# Patient Record
Sex: Female | Born: 1945 | Race: White | Hispanic: No | Smoking: Never smoker
Health system: Southern US, Community
[De-identification: ages and names within clinical notes are randomized; demographics above are authoritative.]

## PROBLEM LIST (undated history)

## (undated) DIAGNOSIS — N189 Chronic kidney disease, unspecified: Secondary | ICD-10-CM

## (undated) DIAGNOSIS — F329 Major depressive disorder, single episode, unspecified: Secondary | ICD-10-CM

## (undated) DIAGNOSIS — D472 Monoclonal gammopathy: Secondary | ICD-10-CM

## (undated) DIAGNOSIS — M7580 Other shoulder lesions, unspecified shoulder: Secondary | ICD-10-CM

## (undated) DIAGNOSIS — F32A Depression, unspecified: Secondary | ICD-10-CM

## (undated) DIAGNOSIS — Z972 Presence of dental prosthetic device (complete) (partial): Secondary | ICD-10-CM

## (undated) DIAGNOSIS — I10 Essential (primary) hypertension: Secondary | ICD-10-CM

## (undated) HISTORY — PX: ABDOMINAL HYSTERECTOMY: SHX81

## (undated) HISTORY — DX: Monoclonal gammopathy: D47.2

## (undated) HISTORY — PX: GANGLION CYST EXCISION: SHX1691

## (undated) HISTORY — DX: Essential (primary) hypertension: I10

## (undated) HISTORY — PX: CARPAL TUNNEL RELEASE: SHX101

## (undated) HISTORY — PX: SHOULDER ARTHROSCOPY W/ ACROMIAL REPAIR: SUR94

## (undated) HISTORY — PX: PARTIAL HYSTERECTOMY: SHX80

## (undated) HISTORY — DX: Other shoulder lesions, unspecified shoulder: M75.80

---

## 2004-07-02 ENCOUNTER — Ambulatory Visit: Payer: Self-pay | Admitting: Internal Medicine

## 2005-07-26 ENCOUNTER — Ambulatory Visit: Payer: Self-pay | Admitting: Internal Medicine

## 2005-07-29 ENCOUNTER — Ambulatory Visit: Payer: Self-pay | Admitting: Internal Medicine

## 2006-02-10 ENCOUNTER — Ambulatory Visit: Payer: Self-pay | Admitting: Internal Medicine

## 2007-01-05 ENCOUNTER — Ambulatory Visit: Payer: Self-pay | Admitting: Nurse Practitioner

## 2008-02-19 ENCOUNTER — Ambulatory Visit: Payer: Self-pay | Admitting: Nurse Practitioner

## 2010-04-30 ENCOUNTER — Emergency Department: Payer: Self-pay | Admitting: Emergency Medicine

## 2012-03-29 DIAGNOSIS — D472 Monoclonal gammopathy: Secondary | ICD-10-CM

## 2012-03-29 HISTORY — DX: Monoclonal gammopathy: D47.2

## 2012-07-26 ENCOUNTER — Ambulatory Visit: Payer: Self-pay | Admitting: Internal Medicine

## 2012-07-26 LAB — HM MAMMOGRAPHY: HM Mammogram: NORMAL

## 2012-10-31 ENCOUNTER — Ambulatory Visit: Payer: Self-pay | Admitting: Internal Medicine

## 2012-11-01 LAB — COMPREHENSIVE METABOLIC PANEL
Albumin: 4 g/dL (ref 3.4–5.0)
Alkaline Phosphatase: 75 U/L (ref 50–136)
BUN: 13 mg/dL (ref 7–18)
Bilirubin,Total: 0.8 mg/dL (ref 0.2–1.0)
Calcium, Total: 9.9 mg/dL (ref 8.5–10.1)
Co2: 31 mmol/L (ref 21–32)
Creatinine: 1.38 mg/dL — ABNORMAL HIGH (ref 0.60–1.30)
EGFR (African American): 46 — ABNORMAL LOW
Glucose: 111 mg/dL — ABNORMAL HIGH (ref 65–99)
Potassium: 3.2 mmol/L — ABNORMAL LOW (ref 3.5–5.1)
SGOT(AST): 52 U/L — ABNORMAL HIGH (ref 15–37)
Sodium: 140 mmol/L (ref 136–145)
Total Protein: 8.4 g/dL — ABNORMAL HIGH (ref 6.4–8.2)

## 2012-11-01 LAB — CBC CANCER CENTER
Basophil #: 0.1 x10 3/mm (ref 0.0–0.1)
Basophil %: 1 %
Eosinophil %: 1.2 %
MCHC: 34.5 g/dL (ref 32.0–36.0)
Monocyte #: 0.8 x10 3/mm (ref 0.2–0.9)
Neutrophil %: 55.4 %
Platelet: 194 x10 3/mm (ref 150–440)
RDW: 13.6 % (ref 11.5–14.5)
WBC: 7.6 x10 3/mm (ref 3.6–11.0)

## 2012-11-04 LAB — PROT IMMUNOELECTROPHORES(ARMC)

## 2012-11-27 ENCOUNTER — Ambulatory Visit: Payer: Self-pay | Admitting: Internal Medicine

## 2013-02-07 ENCOUNTER — Ambulatory Visit: Payer: Self-pay | Admitting: Internal Medicine

## 2013-02-07 LAB — CBC CANCER CENTER
Eosinophil %: 2.9 %
HCT: 42.9 % (ref 35.0–47.0)
Lymphocyte #: 2 x10 3/mm (ref 1.0–3.6)
MCV: 96 fL (ref 80–100)
Monocyte #: 0.7 x10 3/mm (ref 0.2–0.9)
Monocyte %: 8.6 %
Neutrophil #: 4.9 x10 3/mm (ref 1.4–6.5)
RDW: 13.3 % (ref 11.5–14.5)

## 2013-02-07 LAB — BASIC METABOLIC PANEL
BUN: 17 mg/dL (ref 7–18)
Calcium, Total: 10.2 mg/dL — ABNORMAL HIGH (ref 8.5–10.1)
Co2: 32 mmol/L (ref 21–32)
Creatinine: 1.24 mg/dL (ref 0.60–1.30)
EGFR (African American): 52 — ABNORMAL LOW
EGFR (Non-African Amer.): 45 — ABNORMAL LOW
Glucose: 105 mg/dL — ABNORMAL HIGH (ref 65–99)
Potassium: 3.4 mmol/L — ABNORMAL LOW (ref 3.5–5.1)
Sodium: 136 mmol/L (ref 136–145)

## 2013-02-07 LAB — URIC ACID: Uric Acid: 6.2 mg/dL — ABNORMAL HIGH (ref 2.6–6.0)

## 2013-02-09 LAB — PROT IMMUNOELECTROPHORES(ARMC)

## 2013-02-26 ENCOUNTER — Ambulatory Visit: Payer: Self-pay | Admitting: Internal Medicine

## 2013-04-05 ENCOUNTER — Ambulatory Visit: Payer: Self-pay | Admitting: Internal Medicine

## 2013-04-05 LAB — CBC CANCER CENTER
Basophil #: 0.1 x10 3/mm (ref 0.0–0.1)
Basophil %: 1 %
EOS PCT: 2.4 %
Eosinophil #: 0.2 x10 3/mm (ref 0.0–0.7)
HCT: 42.6 % (ref 35.0–47.0)
HGB: 14.1 g/dL (ref 12.0–16.0)
LYMPHS ABS: 2.6 x10 3/mm (ref 1.0–3.6)
Lymphocyte %: 28.8 %
MCH: 32.1 pg (ref 26.0–34.0)
MCHC: 33.1 g/dL (ref 32.0–36.0)
MCV: 97 fL (ref 80–100)
Monocyte #: 0.9 x10 3/mm (ref 0.2–0.9)
Monocyte %: 9.8 %
Neutrophil #: 5.2 x10 3/mm (ref 1.4–6.5)
Neutrophil %: 58 %
PLATELETS: 188 x10 3/mm (ref 150–440)
RBC: 4.39 10*6/uL (ref 3.80–5.20)
RDW: 13.3 % (ref 11.5–14.5)
WBC: 8.9 x10 3/mm (ref 3.6–11.0)

## 2013-04-05 LAB — CREATININE, SERUM
CREATININE: 1.35 mg/dL — AB (ref 0.60–1.30)
EGFR (African American): 47 — ABNORMAL LOW
GFR CALC NON AF AMER: 41 — AB

## 2013-04-05 LAB — CALCIUM: Calcium, Total: 9.8 mg/dL (ref 8.5–10.1)

## 2013-04-09 LAB — PROT IMMUNOELECTROPHORES(ARMC)

## 2013-04-29 ENCOUNTER — Ambulatory Visit: Payer: Self-pay | Admitting: Internal Medicine

## 2013-05-27 HISTORY — PX: MELANOMA EXCISION: SHX5266

## 2013-06-04 ENCOUNTER — Ambulatory Visit: Payer: Self-pay | Admitting: Internal Medicine

## 2013-06-04 LAB — CBC CANCER CENTER
Basophil #: 0.1 x10 3/mm (ref 0.0–0.1)
Basophil %: 0.9 %
Eosinophil #: 0.2 x10 3/mm (ref 0.0–0.7)
Eosinophil %: 2.7 %
HCT: 41.8 % (ref 35.0–47.0)
HGB: 14.1 g/dL (ref 12.0–16.0)
Lymphocyte #: 2.8 x10 3/mm (ref 1.0–3.6)
Lymphocyte %: 33.2 %
MCH: 32.3 pg (ref 26.0–34.0)
MCHC: 33.8 g/dL (ref 32.0–36.0)
MCV: 96 fL (ref 80–100)
MONO ABS: 0.7 x10 3/mm (ref 0.2–0.9)
MONOS PCT: 8.1 %
Neutrophil #: 4.6 x10 3/mm (ref 1.4–6.5)
Neutrophil %: 55.1 %
Platelet: 181 x10 3/mm (ref 150–440)
RBC: 4.36 10*6/uL (ref 3.80–5.20)
RDW: 12.7 % (ref 11.5–14.5)
WBC: 8.4 x10 3/mm (ref 3.6–11.0)

## 2013-06-04 LAB — CREATININE, SERUM
Creatinine: 1.4 mg/dL — ABNORMAL HIGH (ref 0.60–1.30)
EGFR (African American): 45 — ABNORMAL LOW
EGFR (Non-African Amer.): 39 — ABNORMAL LOW

## 2013-06-04 LAB — CALCIUM: CALCIUM: 9.7 mg/dL (ref 8.5–10.1)

## 2013-06-04 LAB — URIC ACID: URIC ACID: 6.2 mg/dL — AB (ref 2.6–6.0)

## 2013-06-05 LAB — PROT IMMUNOELECTROPHORES(ARMC)

## 2013-06-05 LAB — KAPPA/LAMBDA FREE LIGHT CHAINS (ARMC)

## 2013-06-22 LAB — LIPID PANEL
CHOLESTEROL: 206 mg/dL — AB (ref 0–200)
HDL: 60 mg/dL (ref 35–70)
LDL Cholesterol: 111 mg/dL
Triglycerides: 173 mg/dL — AB (ref 40–160)

## 2013-06-22 LAB — TSH: TSH: 3.56 u[IU]/mL (ref <=5.90)

## 2013-06-27 ENCOUNTER — Ambulatory Visit: Payer: Self-pay | Admitting: Internal Medicine

## 2013-06-28 LAB — CBC CANCER CENTER
BASOS ABS: 0.1 x10 3/mm (ref 0.0–0.1)
Basophil %: 0.7 %
EOS PCT: 2 %
Eosinophil #: 0.2 x10 3/mm (ref 0.0–0.7)
HCT: 40.8 % (ref 35.0–47.0)
HGB: 13.8 g/dL (ref 12.0–16.0)
Lymphocyte #: 3.1 x10 3/mm (ref 1.0–3.6)
Lymphocyte %: 34.6 %
MCH: 32.4 pg (ref 26.0–34.0)
MCHC: 33.7 g/dL (ref 32.0–36.0)
MCV: 96 fL (ref 80–100)
Monocyte #: 0.7 x10 3/mm (ref 0.2–0.9)
Monocyte %: 8 %
Neutrophil #: 4.9 x10 3/mm (ref 1.4–6.5)
Neutrophil %: 54.7 %
Platelet: 188 x10 3/mm (ref 150–440)
RBC: 4.25 10*6/uL (ref 3.80–5.20)
RDW: 13.1 % (ref 11.5–14.5)
WBC: 9 x10 3/mm (ref 3.6–11.0)

## 2013-06-28 LAB — CREATININE, SERUM
CREATININE: 1.35 mg/dL — AB (ref 0.60–1.30)
EGFR (African American): 47 — ABNORMAL LOW
GFR CALC NON AF AMER: 41 — AB

## 2013-06-28 LAB — CALCIUM: Calcium, Total: 10.2 mg/dL — ABNORMAL HIGH (ref 8.5–10.1)

## 2013-07-02 LAB — PROT IMMUNOELECTROPHORES(ARMC)

## 2013-07-27 ENCOUNTER — Ambulatory Visit: Payer: Self-pay | Admitting: Internal Medicine

## 2013-08-23 LAB — CBC CANCER CENTER
BASOS PCT: 0.7 %
Basophil #: 0.1 x10 3/mm (ref 0.0–0.1)
Eosinophil #: 0.2 x10 3/mm (ref 0.0–0.7)
Eosinophil %: 2.5 %
HCT: 39.4 % (ref 35.0–47.0)
HGB: 13.7 g/dL (ref 12.0–16.0)
LYMPHS PCT: 34.4 %
Lymphocyte #: 3.1 x10 3/mm (ref 1.0–3.6)
MCH: 33.2 pg (ref 26.0–34.0)
MCHC: 34.7 g/dL (ref 32.0–36.0)
MCV: 96 fL (ref 80–100)
MONO ABS: 0.7 x10 3/mm (ref 0.2–0.9)
Monocyte %: 7.5 %
Neutrophil #: 5 x10 3/mm (ref 1.4–6.5)
Neutrophil %: 54.9 %
Platelet: 163 x10 3/mm (ref 150–440)
RBC: 4.12 10*6/uL (ref 3.80–5.20)
RDW: 12.5 % (ref 11.5–14.5)
WBC: 9 x10 3/mm (ref 3.6–11.0)

## 2013-08-23 LAB — CREATININE, SERUM
Creatinine: 1.31 mg/dL — ABNORMAL HIGH (ref 0.60–1.30)
EGFR (African American): 49 — ABNORMAL LOW
EGFR (Non-African Amer.): 42 — ABNORMAL LOW

## 2013-08-23 LAB — CALCIUM: Calcium, Total: 9.3 mg/dL (ref 8.5–10.1)

## 2013-08-27 ENCOUNTER — Ambulatory Visit: Payer: Self-pay | Admitting: Internal Medicine

## 2013-08-27 LAB — PROT IMMUNOELECTROPHORES(ARMC)

## 2013-08-27 LAB — KAPPA/LAMBDA FREE LIGHT CHAINS (ARMC)

## 2013-08-29 ENCOUNTER — Ambulatory Visit: Payer: Self-pay | Admitting: Internal Medicine

## 2013-10-24 ENCOUNTER — Ambulatory Visit: Payer: Self-pay | Admitting: Internal Medicine

## 2013-10-24 LAB — CBC CANCER CENTER
BASOS PCT: 0.6 %
Basophil #: 0.1 x10 3/mm (ref 0.0–0.1)
Eosinophil #: 0.2 x10 3/mm (ref 0.0–0.7)
Eosinophil %: 2 %
HCT: 40.4 % (ref 35.0–47.0)
HGB: 13.7 g/dL (ref 12.0–16.0)
LYMPHS ABS: 2.6 x10 3/mm (ref 1.0–3.6)
LYMPHS PCT: 28.4 %
MCH: 32.5 pg (ref 26.0–34.0)
MCHC: 34 g/dL (ref 32.0–36.0)
MCV: 95 fL (ref 80–100)
MONOS PCT: 7.1 %
Monocyte #: 0.6 x10 3/mm (ref 0.2–0.9)
Neutrophil #: 5.6 x10 3/mm (ref 1.4–6.5)
Neutrophil %: 61.9 %
PLATELETS: 231 x10 3/mm (ref 150–440)
RBC: 4.23 10*6/uL (ref 3.80–5.20)
RDW: 12.9 % (ref 11.5–14.5)
WBC: 9 x10 3/mm (ref 3.6–11.0)

## 2013-10-24 LAB — CREATININE, SERUM
CREATININE: 1.43 mg/dL — AB (ref 0.60–1.30)
EGFR (African American): 43 — ABNORMAL LOW
GFR CALC NON AF AMER: 38 — AB

## 2013-10-24 LAB — CALCIUM: Calcium, Total: 9.9 mg/dL (ref 8.5–10.1)

## 2013-10-26 LAB — PROT IMMUNOELECTROPHORES(ARMC)

## 2013-10-26 LAB — KAPPA/LAMBDA FREE LIGHT CHAINS (ARMC)

## 2013-10-27 ENCOUNTER — Ambulatory Visit: Payer: Self-pay | Admitting: Internal Medicine

## 2013-11-30 ENCOUNTER — Ambulatory Visit: Payer: Self-pay | Admitting: Internal Medicine

## 2013-12-19 ENCOUNTER — Ambulatory Visit: Payer: Self-pay | Admitting: Internal Medicine

## 2014-01-02 ENCOUNTER — Ambulatory Visit: Payer: Self-pay | Admitting: Internal Medicine

## 2014-01-02 LAB — CBC CANCER CENTER
BASOS ABS: 0.1 x10 3/mm (ref 0.0–0.1)
Basophil %: 1.1 %
Eosinophil #: 0.2 x10 3/mm (ref 0.0–0.7)
Eosinophil %: 1.6 %
HCT: 43.2 % (ref 35.0–47.0)
HGB: 14.4 g/dL (ref 12.0–16.0)
Lymphocyte #: 3.2 x10 3/mm (ref 1.0–3.6)
Lymphocyte %: 33.9 %
MCH: 32.1 pg (ref 26.0–34.0)
MCHC: 33.3 g/dL (ref 32.0–36.0)
MCV: 96 fL (ref 80–100)
Monocyte #: 0.8 x10 3/mm (ref 0.2–0.9)
Monocyte %: 8.8 %
NEUTROS PCT: 54.6 %
Neutrophil #: 5.2 x10 3/mm (ref 1.4–6.5)
Platelet: 218 x10 3/mm (ref 150–440)
RBC: 4.49 10*6/uL (ref 3.80–5.20)
RDW: 12.8 % (ref 11.5–14.5)
WBC: 9.5 x10 3/mm (ref 3.6–11.0)

## 2014-01-02 LAB — CREATININE, SERUM
Creatinine: 1.57 mg/dL — ABNORMAL HIGH (ref 0.60–1.30)
EGFR (Non-African Amer.): 35 — ABNORMAL LOW
GFR CALC AF AMER: 42 — AB

## 2014-01-02 LAB — CALCIUM: CALCIUM: 10.1 mg/dL (ref 8.5–10.1)

## 2014-01-04 LAB — PROT IMMUNOELECTROPHORES(ARMC)

## 2014-01-14 LAB — CREATININE, SERUM
Creatinine: 1.51 mg/dL — ABNORMAL HIGH (ref 0.60–1.30)
EGFR (Non-African Amer.): 36 — ABNORMAL LOW
GFR CALC AF AMER: 44 — AB

## 2014-01-14 LAB — URIC ACID: Uric Acid: 6.4 mg/dL — ABNORMAL HIGH (ref 2.6–6.0)

## 2014-01-15 LAB — CEA: CEA: 2.5 ng/mL (ref 0.0–4.7)

## 2014-01-27 ENCOUNTER — Ambulatory Visit: Payer: Self-pay | Admitting: Internal Medicine

## 2014-03-04 ENCOUNTER — Ambulatory Visit: Payer: Self-pay | Admitting: Internal Medicine

## 2014-03-04 LAB — CALCIUM: CALCIUM: 9.6 mg/dL (ref 8.5–10.1)

## 2014-03-04 LAB — CBC CANCER CENTER
BASOS ABS: 0.1 x10 3/mm (ref 0.0–0.1)
Basophil %: 1 %
EOS PCT: 3 %
Eosinophil #: 0.3 x10 3/mm (ref 0.0–0.7)
HCT: 42.4 % (ref 35.0–47.0)
HGB: 14.2 g/dL (ref 12.0–16.0)
LYMPHS ABS: 2.7 x10 3/mm (ref 1.0–3.6)
LYMPHS PCT: 29.7 %
MCH: 31.9 pg (ref 26.0–34.0)
MCHC: 33.6 g/dL (ref 32.0–36.0)
MCV: 95 fL (ref 80–100)
MONOS PCT: 8.4 %
Monocyte #: 0.8 x10 3/mm (ref 0.2–0.9)
Neutrophil #: 5.2 x10 3/mm (ref 1.4–6.5)
Neutrophil %: 57.9 %
Platelet: 229 x10 3/mm (ref 150–440)
RBC: 4.46 10*6/uL (ref 3.80–5.20)
RDW: 12.8 % (ref 11.5–14.5)
WBC: 9 x10 3/mm (ref 3.6–11.0)

## 2014-03-04 LAB — CREATININE, SERUM
Creatinine: 1.29 mg/dL (ref 0.60–1.30)
EGFR (Non-African Amer.): 44 — ABNORMAL LOW
GFR CALC AF AMER: 53 — AB

## 2014-03-05 LAB — PROT IMMUNOELECTROPHORES(ARMC)

## 2014-03-29 ENCOUNTER — Ambulatory Visit: Payer: Self-pay | Admitting: Internal Medicine

## 2014-05-15 ENCOUNTER — Ambulatory Visit: Payer: Self-pay | Admitting: Internal Medicine

## 2014-05-15 DIAGNOSIS — D472 Monoclonal gammopathy: Secondary | ICD-10-CM | POA: Diagnosis not present

## 2014-05-16 DIAGNOSIS — M1711 Unilateral primary osteoarthritis, right knee: Secondary | ICD-10-CM | POA: Diagnosis not present

## 2014-05-28 ENCOUNTER — Ambulatory Visit
Admit: 2014-05-28 | Disposition: A | Discharge status: Home / Self Care | Payer: Self-pay | Attending: Internal Medicine | Admitting: Internal Medicine

## 2014-06-06 ENCOUNTER — Emergency Department: Payer: Self-pay | Admitting: Emergency Medicine

## 2014-06-06 DIAGNOSIS — R531 Weakness: Secondary | ICD-10-CM | POA: Diagnosis not present

## 2014-06-06 DIAGNOSIS — M6281 Muscle weakness (generalized): Secondary | ICD-10-CM | POA: Diagnosis not present

## 2014-06-06 DIAGNOSIS — I10 Essential (primary) hypertension: Secondary | ICD-10-CM | POA: Diagnosis not present

## 2014-06-06 DIAGNOSIS — R509 Fever, unspecified: Secondary | ICD-10-CM | POA: Diagnosis not present

## 2014-06-06 DIAGNOSIS — Z91040 Latex allergy status: Secondary | ICD-10-CM | POA: Diagnosis not present

## 2014-06-06 DIAGNOSIS — Z79899 Other long term (current) drug therapy: Secondary | ICD-10-CM | POA: Diagnosis not present

## 2014-06-11 DIAGNOSIS — F411 Generalized anxiety disorder: Secondary | ICD-10-CM | POA: Diagnosis not present

## 2014-06-11 DIAGNOSIS — F324 Major depressive disorder, single episode, in partial remission: Secondary | ICD-10-CM | POA: Diagnosis not present

## 2014-06-11 DIAGNOSIS — E876 Hypokalemia: Secondary | ICD-10-CM | POA: Diagnosis not present

## 2014-06-11 DIAGNOSIS — N289 Disorder of kidney and ureter, unspecified: Secondary | ICD-10-CM | POA: Diagnosis not present

## 2014-06-11 DIAGNOSIS — I10 Essential (primary) hypertension: Secondary | ICD-10-CM | POA: Diagnosis not present

## 2014-06-19 DIAGNOSIS — I10 Essential (primary) hypertension: Secondary | ICD-10-CM | POA: Diagnosis not present

## 2014-07-23 ENCOUNTER — Encounter: Payer: Self-pay | Admitting: Internal Medicine

## 2014-07-23 DIAGNOSIS — I10 Essential (primary) hypertension: Secondary | ICD-10-CM | POA: Insufficient documentation

## 2014-07-23 DIAGNOSIS — F324 Major depressive disorder, single episode, in partial remission: Secondary | ICD-10-CM | POA: Insufficient documentation

## 2014-07-23 DIAGNOSIS — N289 Disorder of kidney and ureter, unspecified: Secondary | ICD-10-CM | POA: Insufficient documentation

## 2014-07-23 DIAGNOSIS — E876 Hypokalemia: Secondary | ICD-10-CM | POA: Insufficient documentation

## 2014-07-23 DIAGNOSIS — F5104 Psychophysiologic insomnia: Secondary | ICD-10-CM | POA: Insufficient documentation

## 2014-07-23 DIAGNOSIS — M810 Age-related osteoporosis without current pathological fracture: Secondary | ICD-10-CM | POA: Insufficient documentation

## 2014-07-23 DIAGNOSIS — E785 Hyperlipidemia, unspecified: Secondary | ICD-10-CM | POA: Insufficient documentation

## 2014-07-23 DIAGNOSIS — F411 Generalized anxiety disorder: Secondary | ICD-10-CM | POA: Insufficient documentation

## 2014-07-23 DIAGNOSIS — M546 Pain in thoracic spine: Secondary | ICD-10-CM | POA: Insufficient documentation

## 2014-07-24 ENCOUNTER — Ambulatory Visit
Admit: 2014-07-24 | Disposition: A | Discharge status: Home / Self Care | Payer: Self-pay | Attending: Internal Medicine | Admitting: Internal Medicine

## 2014-07-24 DIAGNOSIS — D472 Monoclonal gammopathy: Secondary | ICD-10-CM | POA: Diagnosis not present

## 2014-07-24 LAB — URIC ACID: Uric Acid: 6.5 mg/dL

## 2014-07-24 LAB — CBC CANCER CENTER
BASOS ABS: 0.1 x10 3/mm (ref 0.0–0.1)
BASOS PCT: 1.1 %
EOS ABS: 0.2 x10 3/mm (ref 0.0–0.7)
Eosinophil %: 2.4 %
HCT: 38.8 % (ref 35.0–47.0)
HGB: 13 g/dL (ref 12.0–16.0)
LYMPHS PCT: 30.2 %
Lymphocyte #: 2.5 x10 3/mm (ref 1.0–3.6)
MCH: 31.9 pg (ref 26.0–34.0)
MCHC: 33.6 g/dL (ref 32.0–36.0)
MCV: 95 fL (ref 80–100)
Monocyte #: 0.7 x10 3/mm (ref 0.2–0.9)
Monocyte %: 8.7 %
NEUTROS ABS: 4.8 x10 3/mm (ref 1.4–6.5)
NEUTROS PCT: 57.6 %
Platelet: 225 x10 3/mm (ref 150–440)
RBC: 4.08 10*6/uL (ref 3.80–5.20)
RDW: 12.9 % (ref 11.5–14.5)
WBC: 8.3 x10 3/mm (ref 3.6–11.0)

## 2014-07-24 LAB — CREATININE, SERUM
Creatinine: 1.07 mg/dL — ABNORMAL HIGH
EGFR (African American): >60
GFR CALC NON AF AMER: 53 — AB

## 2014-07-24 LAB — CALCIUM: Calcium, Total: 9.3 mg/dL

## 2014-07-26 LAB — PROT IMMUNOELECTROPHORES(ARMC)

## 2014-08-19 DIAGNOSIS — I10 Essential (primary) hypertension: Secondary | ICD-10-CM | POA: Diagnosis not present

## 2014-09-10 ENCOUNTER — Other Ambulatory Visit: Payer: Self-pay

## 2014-09-10 ENCOUNTER — Ambulatory Visit: Payer: Self-pay | Admitting: Family Medicine

## 2014-09-11 ENCOUNTER — Other Ambulatory Visit: Payer: Self-pay | Admitting: Internal Medicine

## 2014-09-11 ENCOUNTER — Other Ambulatory Visit: Payer: Self-pay

## 2014-09-11 ENCOUNTER — Ambulatory Visit: Payer: Self-pay | Admitting: Internal Medicine

## 2014-09-19 ENCOUNTER — Encounter: Payer: Self-pay | Admitting: Internal Medicine

## 2014-09-19 ENCOUNTER — Ambulatory Visit (INDEPENDENT_AMBULATORY_CARE_PROVIDER_SITE_OTHER): Payer: Medicare Other | Admitting: Internal Medicine

## 2014-09-19 VITALS — BP 122/60 | HR 76 | Ht 61.5 in | Wt 153.6 lb

## 2014-09-19 DIAGNOSIS — D472 Monoclonal gammopathy: Secondary | ICD-10-CM | POA: Diagnosis not present

## 2014-09-19 DIAGNOSIS — F5104 Psychophysiologic insomnia: Secondary | ICD-10-CM | POA: Diagnosis not present

## 2014-09-19 DIAGNOSIS — I10 Essential (primary) hypertension: Secondary | ICD-10-CM | POA: Diagnosis not present

## 2014-09-19 DIAGNOSIS — N289 Disorder of kidney and ureter, unspecified: Secondary | ICD-10-CM

## 2014-09-19 DIAGNOSIS — E785 Hyperlipidemia, unspecified: Secondary | ICD-10-CM | POA: Diagnosis not present

## 2014-09-19 DIAGNOSIS — Z23 Encounter for immunization: Secondary | ICD-10-CM

## 2014-09-19 DIAGNOSIS — F324 Major depressive disorder, single episode, in partial remission: Secondary | ICD-10-CM | POA: Diagnosis not present

## 2014-09-19 DIAGNOSIS — Z1239 Encounter for other screening for malignant neoplasm of breast: Secondary | ICD-10-CM

## 2014-09-19 DIAGNOSIS — Z7689 Persons encountering health services in other specified circumstances: Secondary | ICD-10-CM | POA: Diagnosis not present

## 2014-09-19 MED ORDER — ONDANSETRON HCL 4 MG PO TABS: 4.0000 mg | ORAL_TABLET | Freq: Three times a day (TID) | ORAL | Status: DC | PRN

## 2014-09-19 NOTE — Progress Notes (Signed)
Patient: Shannon Le, Female    DOB: May 26, 1945, 69 y.o.   MRN: [id number] Visit Date: 09/19/2014  Today's Provider: Halina Maidens, MD   Chief Complaint  Patient presents with  . Medicare Wellness   Subjective:    Annual wellness visit Shannon Le is a 69 y.o. female who presents today for her Subsequent Annual Wellness Visit. She feels fairly well. She reports exercising none. She reports she is sleeping fairly well. She has been under more stress at home - her sister with mental illness has been living with her for several weeks.  She has to do more cooking and cleaning since her sister is no help around the house.  ----------------------------------------------------------- Hypertension This is a chronic problem. The current episode started more than 1 year ago. The problem has been waxing and waning since onset. The problem is controlled. Associated symptoms include anxiety (worse recently since her sister moved in). Pertinent negatives include no chest pain, palpitations or shortness of breath. There are no associated agents to hypertension. Past treatments include calcium channel blockers, beta blockers and diuretics (currently on these three agents). The current treatment provides moderate improvement. Identifiable causes of hypertension include chronic renal disease.  Hyperlipidemia This is a chronic problem. The current episode started more than 1 year ago. Exacerbating diseases include chronic renal disease and hypothyroidism. She has no history of diabetes. Factors aggravating her hyperlipidemia include fatty foods. Pertinent negatives include no chest pain or shortness of breath. Current antihyperlipidemic treatment includes diet change.  Mental Health Problem The primary symptoms include dysphoric mood (and anxiety). This is a chronic problem.  The onset of the illness is precipitated by emotional stress. The degree of incapacity that she is experiencing as a  consequence of her illness is mild. Additional symptoms of the illness include insomnia. Additional symptoms of the illness do not include no appetite change, no agitation, no attention impairment, no flight of ideas, no decreased need for sleep or no abdominal pain. She does not admit to suicidal ideas. Risk factors that are present for mental illness include a family history of mental illness (She is taking her medications and in general is controls her symptoms.).    Review of Systems  Constitutional: Negative for fever, activity change and appetite change.  HENT: Negative for ear pain, hearing loss, tinnitus, trouble swallowing and voice change.   Eyes: Negative for visual disturbance.  Respiratory: Negative for cough, shortness of breath and wheezing.   Cardiovascular: Negative for chest pain, palpitations and leg swelling.  Gastrointestinal: Negative for abdominal pain, diarrhea and blood in stool.  Genitourinary: Negative for dysuria, frequency and hematuria.  Musculoskeletal: Positive for back pain and arthralgias. Negative for joint swelling.  Skin: Negative for color change and rash.  Neurological: Positive for dizziness (intermittent) and light-headedness. Negative for syncope and numbness.  Psychiatric/Behavioral: Positive for sleep disturbance and dysphoric mood (and anxiety). Negative for suicidal ideas and agitation. The patient is nervous/anxious and has insomnia.     History   Social History  . Marital Status: Divorced    Spouse Name: N/A  . Number of Children: N/A  . Years of Education: N/A   Occupational History  . Not on file.   Social History Main Topics  . Smoking status: Never Smoker   . Smokeless tobacco: Not on file  . Alcohol Use: No  . Drug Use: No  . Sexual Activity: Not on file   Other Topics Concern  . Not on file   Social History  Narrative    Patient Active Problem List   Diagnosis Date Noted  . Acute thoracic back pain 07/23/2014  .  Dyslipidemia 07/23/2014  . Essential (primary) hypertension 07/23/2014  . Anxiety, generalized 07/23/2014  . Decreased potassium in the blood 07/23/2014  . Depression, major, single episode, in partial remission 07/23/2014  . OP (osteoporosis) 07/23/2014  . Insomnia, psychophysiological 07/23/2014  . Impaired renal function 07/23/2014    Past Surgical History  Procedure Laterality Date  . Melanoma excision  05/2013    mid back Dr. Aubery Lapping  . Carpal tunnel release Right     and ganglion  . Partial hysterectomy      endometriosis    Her family history includes Mental illness in her other.    Previous Medications   ALPRAZOLAM (XANAX) 1 MG TABLET    Take 0.5 tablets by mouth at bedtime.   AMLODIPINE (NORVASC) 2.5 MG TABLET    Take 5 mg by mouth daily.    BUPROPION (WELLBUTRIN SR) 150 MG 12 HR TABLET    Take 150 mg by mouth 2 (two) times daily.   CITALOPRAM (CELEXA) 10 MG TABLET    TAKE 1 TABLET BY MOUTH EVERY DAY   HYDROCHLOROTHIAZIDE (HYDRODIURIL) 25 MG TABLET    TAKE 1 TABLET BY MOUTH EVERY DAY   METOPROLOL SUCCINATE (TOPROL-XL) 100 MG 24 HR TABLET    Take 1 tablet by mouth daily.    MULTIPLE VITAMIN PO    Take 1 tablet by mouth daily.   TIZANIDINE (ZANAFLEX) 4 MG CAPSULE    Take 1 capsule by mouth 3 (three) times daily as needed.   TRAZODONE (DESYREL) 100 MG TABLET    Take 100 mg by mouth at bedtime.    Patient Care Team: Glean Hess, MD as PCP - General (Family Medicine)     Objective:   Vitals: BP 122/60 mmHg  Pulse 76  Ht 5' 1.5" (1.562 m)  Wt 153 lb 9.6 oz (69.673 kg)  BMI 28.56 kg/m2  Physical Exam  Constitutional: She is oriented to person, place, and time. She appears well-developed and well-nourished.  HENT:  Head: Normocephalic.  Right Ear: External ear and ear canal normal.  Left Ear: External ear and ear canal normal.  Nose: Right sinus exhibits no maxillary sinus tenderness. Left sinus exhibits no maxillary sinus tenderness.  Neck: Normal  range of motion. Neck supple. Normal carotid pulses present. No tracheal tenderness present. Carotid bruit is not present. No thyromegaly present.  Cardiovascular: Normal rate, regular rhythm, S1 normal and intact distal pulses.   Pulmonary/Chest: Effort normal and breath sounds normal. She has no wheezes.  Abdominal: Soft. Normal appearance and normal aorta. There is no hepatosplenomegaly. There is no tenderness. There is no guarding.  Genitourinary: No breast swelling, tenderness, discharge or bleeding. Pelvic exam was performed with patient supine.  Musculoskeletal:       Right hip: Normal.       Left hip: Normal.       Right knee: She exhibits no swelling and no effusion. Tenderness found. Medial joint line tenderness noted.  Lymphadenopathy:    She has no cervical adenopathy.    She has no axillary adenopathy.  Neurological: She is alert and oriented to person, place, and time. She has normal reflexes.  Skin: Skin is warm, dry and intact.  Psychiatric: Her behavior is normal. Thought content normal. Her mood appears anxious. Her speech is rapid and/or pressured. Cognition and memory are normal.    Activities of Daily Living  In your present state of health, do you have any difficulty performing the following activities: 09/19/2014 09/19/2014  Hearing? N N  Vision? N N  Difficulty concentrating or making decisions? N N  Walking or climbing stairs? N N  Dressing or bathing? N N  Doing errands, shopping? N N    Fall Risk Assessment Fall Risk  09/19/2014 09/19/2014  Falls in the past year? Yes No  Number falls in past yr: 1 -  Injury with Fall? No -     Patient reports there are safety devices in place in shower at home.   Depression Screen PHQ 2/9 Scores 09/19/2014 09/19/2014  PHQ - 2 Score 0 0    Cognitive Testing - 6-CIT   Correct? Score   What year is it? yes 0 Yes = 0    No = 4  What month is it? yes 0 Yes = 0    No = 3  Remember:     Pia [address]au, [place], [place]      What time is it? yes 0 Yes = 0    No = 3  Count backwards from 20 to 1 yes 0 Correct = 0    1 error = 2   More than 1 error = 4  Say the months of the year in reverse. yes 0 Correct = 0    1 error = 2   More than 1 error = 4  What address did I ask you to remember? yes 2 Correct = 0  1 error = 2    2 error = 4    3 error = 6    4 error = 8    All wrong = 10       TOTAL SCORE  2/28   Interpretation:  Normal  Normal (0-7) Abnormal (8-28)        Assessment & Plan:     Annual Wellness Visit  Reviewed patient's Family Medical History Reviewed and updated list of patient's medical providers Assessment of cognitive impairment was done Assessed patient's functional ability Established a written schedule for health screening [place] Completed and Reviewed  Exercise Activities and Dietary recommendations Goals    None      Immunization History  Administered Date(s) Administered  . Pneumococcal Conjugate-13 09/19/2014  . Pneumococcal Polysaccharide-23 06/22/2013    Health Maintenance  Topic Date Due  . TETANUS/TDAP  07/27/1964  . COLONOSCOPY  07/28/1995  . ZOSTAVAX  07/27/2005  . DEXA SCAN  07/28/2010  . PNA vac Low Risk Adult (2 of 2 - PCV13) 06/23/2014  . MAMMOGRAM  07/27/2014  . INFLUENZA VACCINE  10/28/2014     Discussed health benefits of physical activity, and encouraged her to engage in regular exercise appropriate for her age and condition.    ------------------------------------------------------------------------------------------------------------ 1. Essential (primary) hypertension The current medical regimen is effective;  continue present plan and medications. - CBC with Differential/Platelet  2. Impaired renal function Followed by Nephrology at [facility] metabolic panel  3. Dyslipidemia Continue dietary changes - Lipid panel  4. Depression, major, single episode, in partial remission Fairly well controlled - more  anxiety today than usual but should improve since she has refused to have her sister move in again - TSH  5. Insomnia, psychophysiological On trazodone with adequate sleep  6. Breast cancer screening Patient declines mammogram  7. Need for pneumococcal vaccination - Pneumococcal conjugate vaccine 13-valent IM  8. Monoclonal gammopathy of undetermined significance  Continue followup with Oncology  Halina Maidens, MD [facility]  09/19/2014

## 2014-09-19 NOTE — Patient Instructions (Addendum)
Breast Self-Awareness Practicing breast self-awareness may pick up problems early, prevent significant medical complications, and possibly save your life. By practicing breast self-awareness, you can become familiar with how your breasts look and feel and if your breasts are changing. This allows you to notice changes early. It can also offer you some reassurance that your breast health is good. One way to learn what is normal for your breasts and whether your breasts are changing is to do a breast self-exam. If you find a lump or something that was not present in the past, it is best to contact your caregiver right away. Other findings that should be evaluated by your caregiver include nipple discharge, especially if it is bloody; skin changes or reddening; areas where the skin seems to be pulled in (retracted); or new lumps and bumps. Breast pain is seldom associated with cancer (malignancy), but should also be evaluated by a caregiver. HOW TO PERFORM A BREAST SELF-EXAM The best time to examine your breasts is 5-7 days after your menstrual period is over. During menstruation, the breasts are lumpier, and it may be more difficult to pick up changes. If you do not menstruate, have reached menopause, or had your uterus removed (hysterectomy), you should examine your breasts at regular intervals, such as monthly. If you are breastfeeding, examine your breasts after a feeding or after using a breast pump. Breast implants do not decrease the risk for lumps or tumors, so continue to perform breast self-exams as recommended. Talk to your caregiver about how to determine the difference between the implant and breast tissue. Also, talk about the amount of pressure you should use during the exam. Over time, you will become more familiar with the variations of your breasts and more comfortable with the exam. A breast self-exam requires you to remove all your clothes above the waist.  Look at your breasts and nipples.  Stand in front of a mirror in a room with good lighting. With your hands on your hips, push your hands firmly downward. Look for a difference in shape, contour, and size from one breast to the other (asymmetry). Asymmetry includes puckers, dips, or bumps. Also, look for skin changes, such as reddened or scaly areas on the breasts. Look for nipple changes, such as discharge, dimpling, repositioning, or redness.  Carefully feel your breasts. This is best done either in the shower or tub while using soapy water or when flat on your back. Place the arm (on the side of the breast you are examining) above your head. Use the pads (not the fingertips) of your three middle fingers on your opposite hand to feel your breasts. Start in the underarm area and use  inch (2 cm) overlapping circles to feel your breast. Use 3 different levels of pressure (light, medium, and firm pressure) at each circle before moving to the next circle. The light pressure is needed to feel the tissue closest to the skin. The medium pressure will help to feel breast tissue a little deeper, while the firm pressure is needed to feel the tissue close to the ribs. Continue the overlapping circles, moving downward over the breast until you feel your ribs below your breast. Then, move one finger-width towards the center of the body. Continue to use the  inch (2 cm) overlapping circles to feel your breast as you move slowly up toward the collar bone (clavicle) near the base of the neck. Continue the up and down exam using all 3 pressures until you reach the  middle of the chest. Do this with each breast, carefully feeling for lumps or changes.   Keep a written record with breast changes or normal findings for each breast. By writing this information down, you do not need to depend only on memory for size, tenderness, or location. Write down where you are in your menstrual cycle, if you are still menstruating. Breast tissue can have some lumps or thick  tissue. However, see your caregiver if you find anything that concerns you.  SEEK MEDICAL CARE IF:  You see a change in shape, contour, or size of your breasts or nipples.   You see skin changes, such as reddened or scaly areas on the breasts or nipples.   You have an unusual discharge from your nipples.   You feel a new lump or unusually thick areas.  Document Released: 03/15/2005 Document Revised: 03/01/2012 Document Reviewed: 06/30/2011 [facility] Patient Information 2015 [place], [place]. This information is not intended to replace advice given to you by your health care provider. Make sure you discuss any questions you have with your health care provider. Pneumococcal Conjugate Vaccine: What You Need to Know Your doctor recommends that you, or your child, get a dose of PCV13 today. 1. Why get vaccinated? Pneumococcal conjugate vaccine (called PCV13 or Prevnar 13) is recommended to protect infants and toddlers, and some older children and adults with certain health conditions, from pneumococcal disease. Pneumococcal disease is caused by infection with Streptococcus pneumoniae bacteria. These bacteria can spread from person to person through close contact. Pneumococcal disease can lead to severe health problems, including pneumonia, blood infections, and meningitis. Meningitis is an infection of the covering of the brain. Pneumococcal meningitis is fairly rare (less than 1 case per 100,000 people each year), but it leads to other health problems, including deafness and brain damage. In children, it is fatal in about 1 case out of 10. Children younger than two are at higher risk for serious disease than older children. People with certain medical conditions, people over age 14, and cigarette smokers are also at higher risk. Before vaccine, pneumococcal infections caused many problems each year in the [place] in children younger than 5, including:  more than 700 cases of  meningitis,  13,000 blood infections,  about 5 million ear infections, and  about 200 deaths. About 4,000 adults still die each year because of pneumococcal infections. Pneumococcal infections can be hard to treat because some strains are resistant to antibiotics. This makes prevention through vaccination even more important. 2. PCV13 vaccine There are more than 90 types of pneumococcal bacteria. PCV13 protects against 13 of them. These 13 strains cause most severe infections in children and about half of infections in adults.  PCV13 is routinely given to children at 2, 4, 6, and 49-48 months of age. Children in this age range are at greatest risk for serious diseases caused by pneumococcal infection. PCV13 vaccine may also be recommended for some older children or adults. Your doctor can give you details. A second type of pneumococcal vaccine, called PPSV23, may also be given to some children and adults, including anyone over age 38. There is a separate Vaccine Information Statement for this vaccine. 3. Precautions  Anyone who has ever had a life-threatening allergic reaction to a dose of this vaccine, to an earlier pneumococcal vaccine called PCV7 (or Prevnar), or to any vaccine containing diphtheria toxoid (for example, DTaP), should not get PCV13. Anyone with a severe allergy to any component of PCV13 should not get  the vaccine. Tell your doctor if the person being vaccinated has any severe allergies. If the person scheduled for vaccination is sick, your doctor might decide to reschedule the shot on another day. Your doctor can give you more information about any of these precautions. 4. What are the risks of PCV13 vaccine?  With any medicine, including vaccines, there is a chance of side effects. These are usually mild and go away on their own, but serious reactions are also possible. Reported problems associated with PCV13 vary by dose and age, but generally:  About half of children  became drowsy after the shot, had a temporary loss of appetite, or had redness or tenderness where the shot was given.  About 1 out of 3 had swelling where the shot was given.  About 1 out of 3 had a mild fever, and about 1 in 20 had a higher fever (over 102.42F).  Up to about 8 out of 10 became fussy or irritable. Adults receiving the vaccine have reported redness, pain, and swelling where the shot was given. Mild fever, fatigue, headache, chills, or muscle pain have also been reported. Life-threatening allergic reactions from any vaccine are very rare. 5. What if there is a serious reaction? What should I look for?  Look for anything that concerns you, such as signs of a severe allergic reaction, very high fever, or behavior changes. Signs of a severe allergic reaction can include hives, swelling of the face and throat, difficulty breathing, a fast heartbeat, dizziness, and weakness. These would start a few minutes to a few hours after the vaccination. What should I do?  If you think it is a severe allergic reaction or other emergency that can't wait, call 9-1-1 or get the person to the nearest hospital. Otherwise, call your doctor.  Afterward, the reaction should be reported to the Vaccine Adverse Event Reporting System (VAERS). Your doctor might file this report, or you can do it yourself through the VAERS web site at www.vaers.SamedayNews.es, or by calling 713-730-1489. VAERS is only for reporting reactions. They do not give medical advice. 6. The National Vaccine Injury Compensation Program The [employer] Vaccine Injury Compensation Program (VICP) is a federal program that was created to compensate people who may have been injured by certain vaccines. Persons who believe they may have been injured by a vaccine can learn about the program and about filing a claim by calling 343-792-2759 or visiting the [place] website at GoldCloset.com.ee. 7. How can I learn more?  Ask your  doctor.  Call your local or state health department.  Contact the Centers for Disease Control and Prevention (CDC):  Call (626)729-6195 (1-800-CDC-INFO) or  Visit CDC's website at http://hunter.com/ CDC PCV13 Vaccine VIS (Interim) (05/26/11) Document Released: 01/10/2006 Document Revised: 07/30/2013 Document Reviewed: 05/04/2013 [facility] Patient Information 2015 [place], [place]. This information is not intended to replace advice given to you by your health care provider. Make sure you discuss any questions you have with your health care provider.  Health Maintenance  Topic Date Due  . TETANUS/TDAP  07/27/1964  . COLONOSCOPY  07/28/1995  . ZOSTAVAX  Once after age 45  . DEXA SCAN  07/28/2010  . PNA vac Low Risk Adult (2 of 2 - PCV13) Given today  . MAMMOGRAM  Not needed after age 65  . INFLUENZA VACCINE  10/28/2014

## 2014-09-20 LAB — COMPREHENSIVE METABOLIC PANEL
A/G RATIO: 1.3 (ref 1.1–2.5)
ALK PHOS: 521 IU/L — AB (ref 39–117)
ALT: 55 IU/L — ABNORMAL HIGH (ref 0–32)
AST: 68 IU/L — AB (ref 0–40)
Albumin: 3.9 g/dL (ref 3.6–4.8)
BILIRUBIN TOTAL: 1.3 mg/dL — AB (ref 0.0–1.2)
BUN/Creatinine Ratio: 8 — ABNORMAL LOW (ref 11–26)
BUN: 9 mg/dL (ref 8–27)
CO2: 25 mmol/L (ref 18–29)
Calcium: 9.5 mg/dL (ref 8.7–10.3)
Chloride: 98 mmol/L (ref 97–108)
Creatinine, Ser: 1.08 mg/dL — ABNORMAL HIGH (ref 0.57–1.00)
GFR, EST AFRICAN AMERICAN: 61 mL/min/{1.73_m2} (ref >59)
GFR, EST NON AFRICAN AMERICAN: 53 mL/min/{1.73_m2} — AB (ref >59)
Globulin, Total: 3 g/dL (ref 1.5–4.5)
Glucose: 97 mg/dL (ref 65–99)
Potassium: 3.5 mmol/L (ref 3.5–5.2)
SODIUM: 139 mmol/L (ref 134–144)
Total Protein: 6.9 g/dL (ref 6.0–8.5)

## 2014-09-20 LAB — CBC WITH DIFFERENTIAL/PLATELET
BASOS ABS: 0.1 10*3/uL (ref 0.0–0.2)
Basos: 1 %
EOS (ABSOLUTE): 0.1 10*3/uL (ref 0.0–0.4)
Eos: 1 %
Hematocrit: 39.6 % (ref 34.0–46.6)
Hemoglobin: 13.2 g/dL (ref 11.1–15.9)
IMMATURE GRANS (ABS): 0 10*3/uL (ref 0.0–0.1)
IMMATURE GRANULOCYTES: 0 %
Lymphocytes Absolute: 1.8 10*3/uL (ref 0.7–3.1)
Lymphs: 24 %
MCH: 31 pg (ref 26.6–33.0)
MCHC: 33.3 g/dL (ref 31.5–35.7)
MCV: 93 fL (ref 79–97)
MONOCYTES: 10 %
MONOS ABS: 0.7 10*3/uL (ref 0.1–0.9)
NEUTROS PCT: 64 %
Neutrophils Absolute: 4.8 10*3/uL (ref 1.4–7.0)
PLATELETS: 288 10*3/uL (ref 150–379)
RBC: 4.26 x10E6/uL (ref 3.77–5.28)
RDW: 12.9 % (ref 12.3–15.4)
WBC: 7.5 10*3/uL (ref 3.4–10.8)

## 2014-09-20 LAB — LIPID PANEL
CHOL/HDL RATIO: 3.4 ratio (ref 0.0–4.4)
Cholesterol, Total: 164 mg/dL (ref 100–199)
HDL: 48 mg/dL (ref >39)
LDL Calculated: 93 mg/dL (ref 0–99)
Triglycerides: 115 mg/dL (ref 0–149)
VLDL Cholesterol Cal: 23 mg/dL (ref 5–40)

## 2014-09-20 LAB — TSH: TSH: 3.39 u[IU]/mL (ref 0.450–4.500)

## 2014-09-21 ENCOUNTER — Encounter: Payer: Self-pay | Admitting: Internal Medicine

## 2014-09-21 DIAGNOSIS — R945 Abnormal results of liver function studies: Secondary | ICD-10-CM

## 2014-09-21 DIAGNOSIS — R7989 Other specified abnormal findings of blood chemistry: Secondary | ICD-10-CM | POA: Insufficient documentation

## 2014-09-21 LAB — HEPATITIS C ANTIBODY

## 2014-09-21 LAB — HEPATITIS B SURFACE ANTIBODY,QUALITATIVE: Hep B Surface Ab, Qual: NONREACTIVE

## 2014-09-24 LAB — SPECIMEN STATUS REPORT

## 2014-09-25 ENCOUNTER — Inpatient Hospital Stay: Payer: Medicaid Other | Admitting: Family Medicine

## 2014-09-25 ENCOUNTER — Inpatient Hospital Stay: Payer: Medicaid Other

## 2014-10-09 ENCOUNTER — Inpatient Hospital Stay: Payer: Medicare Other

## 2014-10-09 ENCOUNTER — Inpatient Hospital Stay: Payer: Medicare Other | Attending: Family Medicine | Admitting: Family Medicine

## 2014-10-09 VITALS — BP 152/74 | HR 55 | Temp 99.0°F | Wt 150.8 lb

## 2014-10-09 DIAGNOSIS — Z79899 Other long term (current) drug therapy: Secondary | ICD-10-CM | POA: Diagnosis not present

## 2014-10-09 DIAGNOSIS — Z8639 Personal history of other endocrine, nutritional and metabolic disease: Secondary | ICD-10-CM

## 2014-10-09 DIAGNOSIS — R7989 Other specified abnormal findings of blood chemistry: Secondary | ICD-10-CM

## 2014-10-09 DIAGNOSIS — D472 Monoclonal gammopathy: Secondary | ICD-10-CM | POA: Insufficient documentation

## 2014-10-09 DIAGNOSIS — Z90710 Acquired absence of both cervix and uterus: Secondary | ICD-10-CM | POA: Diagnosis not present

## 2014-10-09 DIAGNOSIS — Z85828 Personal history of other malignant neoplasm of skin: Secondary | ICD-10-CM | POA: Diagnosis not present

## 2014-10-09 LAB — CBC
HCT: 40.6 % (ref 35.0–47.0)
Hemoglobin: 13.4 g/dL (ref 12.0–16.0)
MCH: 30.6 pg (ref 26.0–34.0)
MCHC: 33 g/dL (ref 32.0–36.0)
MCV: 92.8 fL (ref 80.0–100.0)
PLATELETS: 264 10*3/uL (ref 150–440)
RBC: 4.38 MIL/uL (ref 3.80–5.20)
RDW: 12.7 % (ref 11.5–14.5)
WBC: 9.2 10*3/uL (ref 3.6–11.0)

## 2014-10-09 LAB — CALCIUM: Calcium: 9.2 mg/dL (ref 8.9–10.3)

## 2014-10-09 LAB — URIC ACID: Uric Acid, Serum: 6.8 mg/dL — ABNORMAL HIGH (ref 2.3–6.6)

## 2014-10-09 LAB — CREATININE, SERUM
CREATININE: 1.43 mg/dL — AB (ref 0.44–1.00)
GFR calc Af Amer: 42 mL/min — ABNORMAL LOW (ref >60)
GFR, EST NON AFRICAN AMERICAN: 36 mL/min — AB (ref >60)

## 2014-10-09 NOTE — Progress Notes (Signed)
This encounter was created in error - please disregard.

## 2014-10-09 NOTE — Progress Notes (Signed)
Shannon Le  [phone]  [phone]     Shannon Le DOB: 21-Feb-1946  MR#: [id number]  POE#:[id number]  Patient Care Team: Glean Hess, MD as PCP - General (Family Medicine)  CHIEF COMPLAINT:  Chief Complaint  Patient presents with  . 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  INTERVAL HISTORY:  Patient is here for continued evaluation regarding monoclonal gammopathy first diagnosed in August 2014. She has no complaints today of any fever, chills, weakness, chest pain, shortness of breath, cough. She has been following closely with her primary care doctor Dr. Army Melia as well as her nephrologist Dr. Smith Mince.  REVIEW OF SYSTEMS:   Review of Systems  Constitutional: Negative for fever, chills, weight loss, malaise/fatigue and diaphoresis.  HENT: Negative for congestion, ear discharge, ear pain, hearing loss, nosebleeds, sore throat and tinnitus.   Eyes: Negative for blurred vision, double vision, photophobia, pain, discharge and redness.  Respiratory: Negative for cough, hemoptysis, sputum production, shortness of breath, wheezing and stridor.   Cardiovascular: Negative for chest pain, palpitations, orthopnea, claudication, leg swelling and PND.  Gastrointestinal: Negative for heartburn, nausea, vomiting, abdominal pain, diarrhea, constipation, blood in stool and melena.  Genitourinary: Negative.   Musculoskeletal: Negative.   Skin: Negative.   Neurological: Negative for dizziness, tingling, focal weakness, seizures, weakness and headaches.  Endo/Heme/Allergies: Does not bruise/bleed easily.  Psychiatric/Behavioral: Negative for depression. The patient is not nervous/anxious and does not have insomnia.     As per HPI. Otherwise, a complete review of systems is negatve.  ONCOLOGY HISTORY:  No history exists.    PAST MEDICAL HISTORY: Past Medical History  Diagnosis Date  .  Monoclonal gammopathy of undetermined significance 2014    [facility]    PAST SURGICAL HISTORY: Past Surgical History  Procedure Laterality Date  . Melanoma excision  05/2013    mid back Dr. Aubery Lapping  . Carpal tunnel release Right     and ganglion  . Partial hysterectomy      endometriosis    FAMILY HISTORY Family History  Problem Relation Age of Onset  . Mental illness Other     GYNECOLOGIC HISTORY:  No LMP recorded. Patient is postmenopausal.     ADVANCED DIRECTIVES:    HEALTH MAINTENANCE: History  Substance Use Topics  . Smoking status: Never Smoker   . Smokeless tobacco: Not on file  . Alcohol Use: No     Colonoscopy:  PAP:  Bone density:  Lipid panel:  Allergies  Allergen Reactions  . Codeine Shortness Of Breath  . Alendronate Sodium Other (See Comments)    unknown  . Atorvastatin Diarrhea and Nausea And Vomiting  . Bee Venom Other (See Comments)  . Diazepam     Nervous  . Fosamax  [Alendronate]   . Penicillins Swelling    and oral ulcers    Current Outpatient Prescriptions  Medication Sig Dispense Refill  . ALPRAZolam (XANAX) 1 MG tablet Take 0.5 tablets by mouth at bedtime.    Marland Kitchen amLODipine (NORVASC) 2.5 MG tablet Take 5 mg by mouth daily.     Marland Kitchen buPROPion (WELLBUTRIN SR) 150 MG 12 hr tablet Take 150 mg by mouth 2 (two) times daily.    . citalopram (CELEXA) 10 MG tablet TAKE 1 TABLET BY MOUTH EVERY DAY 90 tablet 1  . hydrochlorothiazide (HYDRODIURIL) 25 MG tablet TAKE 1 TABLET BY MOUTH EVERY DAY (Patient taking differently: TAKE 0.5 TABLET BY MOUTH  EVERY DAY) 90 tablet 1  . metoprolol succinate (TOPROL-XL) 100 MG 24 hr tablet Take 1 tablet by mouth daily.     . MULTIPLE VITAMIN PO Take 1 tablet by mouth daily.    . ondansetron (ZOFRAN) 4 MG tablet Take 1 tablet (4 mg total) by mouth every 8 (eight) hours as needed for nausea or vomiting. 30 tablet 1  . tiZANidine (ZANAFLEX) 4 MG capsule Take 1 capsule by mouth 3 (three) times daily as  needed.    . traZODone (DESYREL) 100 MG tablet Take 100 mg by mouth at bedtime.     No current facility-administered medications for this visit.    OBJECTIVE: BP 152/74 mmHg  Pulse 55  Temp(Src) 99 F (37.2 C) (Tympanic)  Wt 150 lb 12.7 oz (68.4 kg)   Body mass index is 28.03 kg/(m^2).    ECOG FS:0 - Asymptomatic  General: Well-developed, well-nourished, no acute distress. Eyes: Pink conjunctiva, anicteric sclera. HEENT: Normocephalic, moist mucous membranes, clear oropharnyx. Lungs: Clear to auscultation bilaterally. Heart: Regular rate and rhythm. No rubs, murmurs, or gallops. Musculoskeletal: No edema, cyanosis, or clubbing. Neuro: Alert, answering all questions appropriately. Cranial nerves grossly intact. Skin: No rashes or petechiae noted. Psych: Normal affect.    LAB RESULTS:  Appointment on 10/09/2014  Component Date Value Ref Range Status  . Calcium 10/09/2014 9.2  8.9 - 10.3 mg/dL Final  . Creatinine, Ser 10/09/2014 1.43* 0.44 - 1.00 mg/dL Final  . GFR calc non Af Amer 10/09/2014 36* >60 mL/min Final  . GFR calc Af Amer 10/09/2014 42* >60 mL/min Final   Comment: (NOTE) The eGFR has been calculated using the CKD EPI equation. This calculation has not been validated in all clinical situations. eGFR's persistently <60 mL/min signify possible Chronic Kidney Disease.   Marland Kitchen Uric Acid, Serum 10/09/2014 6.8* 2.3 - 6.6 mg/dL Final  . WBC 10/09/2014 9.2  3.6 - 11.0 K/uL Final  . RBC 10/09/2014 4.38  3.80 - 5.20 MIL/uL Final  . Hemoglobin 10/09/2014 13.4  12.0 - 16.0 g/dL Final  . HCT 10/09/2014 40.6  35.0 - 47.0 % Final  . MCV 10/09/2014 92.8  80.0 - 100.0 fL Final  . MCH 10/09/2014 30.6  26.0 - 34.0 pg Final  . MCHC 10/09/2014 33.0  32.0 - 36.0 g/dL Final  . RDW 10/09/2014 12.7  11.5 - 14.5 % Final  . Platelets 10/09/2014 264  150 - 440 K/uL Final    STUDIES: No results found.  ASSESSMENT:  Monoclonal gammopathy  PLAN:   1. Monoclonal gammopathy. Initially  seen in 2014 with 900 mg IgG spike. Her UPEP, SPEP, and light chains were normal. Today's labs are pending, calcium has  been checked and reported as normal.  Creatinine reported as 1.43, she does follow with nephrology. Currently there is no suspicion for multiple myeloma.  We'll continue with routine evaluation of labs in 3 months. Next provider visit will be in 6 months with labs as well.  Patient expressed understanding and was in agreement with this plan. She also understands that She can call clinic at any time with any questions, concerns, or complaints.   Dr. Oliva Bustard was available for consultation and review of plan of care for this patient.    Evlyn Kanner, NP   10/09/2014 3:00 PM

## 2014-10-11 LAB — IMMUNOFIXATION ELECTROPHORESIS
IgA: 338 mg/dL (ref 87–352)
IgG (Immunoglobin G), Serum: 1149 mg/dL (ref 700–1600)
IgM, Serum: 173 mg/dL (ref 26–217)
Total Protein ELP: 6.7 g/dL (ref 6.0–8.5)

## 2014-10-13 ENCOUNTER — Other Ambulatory Visit: Payer: Self-pay | Admitting: Internal Medicine

## 2014-11-18 ENCOUNTER — Other Ambulatory Visit: Payer: Self-pay | Admitting: Internal Medicine

## 2015-01-08 ENCOUNTER — Inpatient Hospital Stay: Payer: Medicare Other

## 2015-01-15 ENCOUNTER — Inpatient Hospital Stay: Payer: Medicare Other

## 2015-01-20 ENCOUNTER — Inpatient Hospital Stay: Payer: Medicare Other | Attending: Family Medicine

## 2015-01-20 DIAGNOSIS — D472 Monoclonal gammopathy: Secondary | ICD-10-CM | POA: Diagnosis not present

## 2015-01-20 LAB — BASIC METABOLIC PANEL
ANION GAP: 7 (ref 5–15)
BUN: 10 mg/dL (ref 6–20)
CO2: 27 mmol/L (ref 22–32)
Calcium: 8.7 mg/dL — ABNORMAL LOW (ref 8.9–10.3)
Chloride: 102 mmol/L (ref 101–111)
Creatinine, Ser: 1.28 mg/dL — ABNORMAL HIGH (ref 0.44–1.00)
GFR, EST AFRICAN AMERICAN: 48 mL/min — AB (ref >60)
GFR, EST NON AFRICAN AMERICAN: 42 mL/min — AB (ref >60)
Glucose, Bld: 117 mg/dL — ABNORMAL HIGH (ref 65–99)
Potassium: 3.5 mmol/L (ref 3.5–5.1)
SODIUM: 136 mmol/L (ref 135–145)

## 2015-01-20 LAB — CBC WITH DIFFERENTIAL/PLATELET
BASOS ABS: 0.1 10*3/uL (ref 0–0.1)
BASOS PCT: 1 %
EOS PCT: 1 %
Eosinophils Absolute: 0.1 10*3/uL (ref 0–0.7)
HEMATOCRIT: 40.8 % (ref 35.0–47.0)
Hemoglobin: 13.9 g/dL (ref 12.0–16.0)
Lymphocytes Relative: 20 %
Lymphs Abs: 1.9 10*3/uL (ref 1.0–3.6)
MCH: 31.4 pg (ref 26.0–34.0)
MCHC: 34 g/dL (ref 32.0–36.0)
MCV: 92.3 fL (ref 80.0–100.0)
Monocytes Absolute: 0.7 10*3/uL (ref 0.2–0.9)
Monocytes Relative: 8 %
NEUTROS ABS: 6.4 10*3/uL (ref 1.4–6.5)
Neutrophils Relative %: 70 %
Platelets: 281 10*3/uL (ref 150–440)
RBC: 4.42 MIL/uL (ref 3.80–5.20)
RDW: 13.3 % (ref 11.5–14.5)
WBC: 9.2 10*3/uL (ref 3.6–11.0)

## 2015-01-22 LAB — PROTEIN ELECTROPHORESIS, SERUM
A/G Ratio: 1 (ref 0.7–1.7)
Albumin ELP: 3.3 g/dL (ref 2.9–4.4)
Alpha-1-Globulin: 0.3 g/dL (ref 0.0–0.4)
Alpha-2-Globulin: 0.8 g/dL (ref 0.4–1.0)
BETA GLOBULIN: 1.1 g/dL (ref 0.7–1.3)
GLOBULIN, TOTAL: 3.2 g/dL (ref 2.2–3.9)
Gamma Globulin: 1 g/dL (ref 0.4–1.8)
M-Spike, %: 0.7 g/dL — ABNORMAL HIGH
Total Protein ELP: 6.5 g/dL (ref 6.0–8.5)

## 2015-01-28 DIAGNOSIS — I129 Hypertensive chronic kidney disease with stage 1 through stage 4 chronic kidney disease, or unspecified chronic kidney disease: Secondary | ICD-10-CM | POA: Diagnosis not present

## 2015-01-28 DIAGNOSIS — D472 Monoclonal gammopathy: Secondary | ICD-10-CM | POA: Diagnosis not present

## 2015-01-28 DIAGNOSIS — N183 Chronic kidney disease, stage 3 (moderate): Secondary | ICD-10-CM | POA: Diagnosis not present

## 2015-02-16 ENCOUNTER — Other Ambulatory Visit: Payer: Self-pay | Admitting: Internal Medicine

## 2015-02-18 ENCOUNTER — Other Ambulatory Visit: Payer: Self-pay | Admitting: Internal Medicine

## 2015-04-01 ENCOUNTER — Other Ambulatory Visit: Payer: Self-pay | Admitting: Internal Medicine

## 2015-04-16 ENCOUNTER — Other Ambulatory Visit: Payer: Medicare Other

## 2015-04-16 ENCOUNTER — Ambulatory Visit: Payer: Medicare Other

## 2015-04-30 ENCOUNTER — Inpatient Hospital Stay: Payer: Medicare Other | Attending: Internal Medicine

## 2015-04-30 ENCOUNTER — Encounter: Payer: Self-pay | Admitting: Internal Medicine

## 2015-04-30 ENCOUNTER — Other Ambulatory Visit: Payer: Self-pay | Admitting: Family Medicine

## 2015-04-30 ENCOUNTER — Inpatient Hospital Stay (HOSPITAL_BASED_OUTPATIENT_CLINIC_OR_DEPARTMENT_OTHER): Payer: Medicare Other | Admitting: Internal Medicine

## 2015-04-30 VITALS — BP 116/79 | HR 59 | Temp 98.1°F | Resp 18 | Ht 61.5 in | Wt 121.0 lb

## 2015-04-30 DIAGNOSIS — M898X9 Other specified disorders of bone, unspecified site: Secondary | ICD-10-CM

## 2015-04-30 DIAGNOSIS — E790 Hyperuricemia without signs of inflammatory arthritis and tophaceous disease: Secondary | ICD-10-CM

## 2015-04-30 DIAGNOSIS — Z85828 Personal history of other malignant neoplasm of skin: Secondary | ICD-10-CM

## 2015-04-30 DIAGNOSIS — D472 Monoclonal gammopathy: Secondary | ICD-10-CM

## 2015-04-30 DIAGNOSIS — Z79899 Other long term (current) drug therapy: Secondary | ICD-10-CM

## 2015-04-30 DIAGNOSIS — R7989 Other specified abnormal findings of blood chemistry: Secondary | ICD-10-CM

## 2015-04-30 DIAGNOSIS — R634 Abnormal weight loss: Secondary | ICD-10-CM | POA: Diagnosis not present

## 2015-04-30 DIAGNOSIS — E876 Hypokalemia: Secondary | ICD-10-CM | POA: Insufficient documentation

## 2015-04-30 DIAGNOSIS — R945 Abnormal results of liver function studies: Secondary | ICD-10-CM

## 2015-04-30 LAB — CBC WITH DIFFERENTIAL/PLATELET
BASOS ABS: 0.1 10*3/uL (ref 0–0.1)
Basophils Relative: 1 %
Eosinophils Absolute: 0.1 10*3/uL (ref 0–0.7)
Eosinophils Relative: 1 %
HEMATOCRIT: 39.6 % (ref 35.0–47.0)
Hemoglobin: 13.7 g/dL (ref 12.0–16.0)
LYMPHS ABS: 1.8 10*3/uL (ref 1.0–3.6)
Lymphocytes Relative: 19 %
MCH: 31.9 pg (ref 26.0–34.0)
MCHC: 34.6 g/dL (ref 32.0–36.0)
MCV: 92.1 fL (ref 80.0–100.0)
Monocytes Absolute: 0.6 10*3/uL (ref 0.2–0.9)
Monocytes Relative: 7 %
NEUTROS ABS: 6.9 10*3/uL — AB (ref 1.4–6.5)
Neutrophils Relative %: 72 %
Platelets: 321 10*3/uL (ref 150–440)
RBC: 4.3 MIL/uL (ref 3.80–5.20)
RDW: 13.2 % (ref 11.5–14.5)
WBC: 9.5 10*3/uL (ref 3.6–11.0)

## 2015-04-30 LAB — BASIC METABOLIC PANEL
ANION GAP: 11 (ref 5–15)
BUN: 17 mg/dL (ref 6–20)
CHLORIDE: 96 mmol/L — AB (ref 101–111)
CO2: 25 mmol/L (ref 22–32)
Calcium: 9.7 mg/dL (ref 8.9–10.3)
Creatinine, Ser: 1.31 mg/dL — ABNORMAL HIGH (ref 0.44–1.00)
GFR calc Af Amer: 47 mL/min — ABNORMAL LOW (ref >60)
GFR calc non Af Amer: 41 mL/min — ABNORMAL LOW (ref >60)
Glucose, Bld: 135 mg/dL — ABNORMAL HIGH (ref 65–99)
Potassium: 2.7 mmol/L — CL (ref 3.5–5.1)
Sodium: 132 mmol/L — ABNORMAL LOW (ref 135–145)

## 2015-04-30 LAB — URIC ACID: Uric Acid, Serum: 5.8 mg/dL (ref 2.3–6.6)

## 2015-04-30 LAB — HEPATIC FUNCTION PANEL
ALT: 78 U/L — ABNORMAL HIGH (ref 14–54)
AST: 108 U/L — ABNORMAL HIGH (ref 15–41)
Albumin: 3.9 g/dL (ref 3.5–5.0)
Alkaline Phosphatase: 739 U/L — ABNORMAL HIGH (ref 38–126)
Bilirubin, Direct: 2.1 mg/dL — ABNORMAL HIGH (ref 0.1–0.5)
Indirect Bilirubin: 1.8 mg/dL — ABNORMAL HIGH (ref 0.3–0.9)
Total Bilirubin: 3.9 mg/dL — ABNORMAL HIGH (ref 0.3–1.2)
Total Protein: 8.1 g/dL (ref 6.5–8.1)

## 2015-04-30 LAB — SEDIMENTATION RATE: Sed Rate: 79 mm/h — ABNORMAL HIGH (ref 0–30)

## 2015-04-30 LAB — MAGNESIUM: Magnesium: 1.9 mg/dL (ref 1.7–2.4)

## 2015-04-30 MED ORDER — POTASSIUM CHLORIDE CRYS ER 20 MEQ PO TBCR: 20.0000 meq | EXTENDED_RELEASE_TABLET | Freq: Two times a day (BID) | ORAL | Status: DC

## 2015-04-30 NOTE — Progress Notes (Signed)
Pt having trouble with feeling weak and almost passing out over last 2 months. She has generalized pain back, legs, feet. Describes the feet and legs as cramps. She states from dec 2015 to dec 2016 lost 50 punds per pt. Eats well.

## 2015-04-30 NOTE — Addendum Note (Signed)
Addended by: Luella Cook on: 04/30/2015 05:27 PM   Modules accepted: Orders

## 2015-04-30 NOTE — Patient Instructions (Signed)
Stop Hydrochlorathiazide until next week when her labs are checked. Take potassium chloride 20 twice a day until you come back to check your level of potassium at cancer center

## 2015-04-30 NOTE — Addendum Note (Signed)
Addended by: Roxana Hires on: 04/30/2015 04:22 PM   Modules accepted: Level of Service

## 2015-04-30 NOTE — Progress Notes (Addendum)
[facility]  [phone]  [phone]     Shannon Le DOB: Oct 08, 1945  MR#: [id number]  MOQ#:[id number]  Patient Care Team: Glean Hess, MD as PCP - General (Family Medicine)  CHIEF COMPLAINT:  No chief complaint on file.  Patient has history of monoclonal gammopathy, M spike of 900 mg found in July 2014.  INTERVAL HISTORY:  Shannon Le returns to our clinic for follow-up visit. She has been complaining of a diffuse back, leg and feet pain over the past several months. She claims to have lost significant amount of weight, but claims to be eating healthy diet, namely vegetables, but eats fish , poultry and meat at least twice a week. She denies fevers, lymphadenopathy, chest pain, shortness of breath, cough, night sweats.  REVIEW OF SYSTEMS:   Review of Systems   HENT: Negative for congestion, ear discharge, ear pain, hearing loss, nosebleeds, sore throat and tinnitus.   Eyes: Negative for blurred vision, double vision, photophobia, pain, discharge and redness.  Respiratory: Negative for cough, hemoptysis, sputum production, shortness of breath, wheezing and stridor.   Cardiovascular: Negative for chest pain, palpitations, orthopnea, claudication, leg swelling and PND.  Gastrointestinal: Negative for heartburn, nausea, vomiting, abdominal pain, diarrhea, constipation, blood in stool and melena.  Genitourinary: Negative.   Musculoskeletal: Negative.   Skin: Negative.   Neurological: Negative for dizziness, tingling, focal weakness, seizures, weakness and headaches.  Endo/Heme/Allergies: Does not bruise/bleed easily.  Psychiatric/Behavioral: Negative for depression. The patient is not nervous/anxious and does not have insomnia.     As per HPI. Otherwise, a complete review of systems is negatve.  ONCOLOGY HISTORY:  No history exists.    PAST MEDICAL HISTORY: Past Medical History  Diagnosis Date  . Monoclonal gammopathy of  undetermined significance 2014    [facility]    PAST SURGICAL HISTORY: Past Surgical History  Procedure Laterality Date  . Melanoma excision  05/2013    mid back Dr. Aubery Lapping  . Carpal tunnel release Right     and ganglion  . Partial hysterectomy      endometriosis    FAMILY HISTORY Family History  Problem Relation Age of Onset  . Mental illness Other     GYNECOLOGIC HISTORY:  No LMP recorded. Patient is postmenopausal.     ADVANCED DIRECTIVES:    HEALTH MAINTENANCE: Social History  Substance Use Topics  . Smoking status: Never Smoker   . Smokeless tobacco: Not on file  . Alcohol Use: No     Colonoscopy:  PAP:  Bone density:  Lipid panel:  Allergies  Allergen Reactions  . Codeine Shortness Of Breath  . Alendronate Sodium Other (See Comments)    unknown  . Atorvastatin Diarrhea and Nausea And Vomiting  . Bee Venom Other (See Comments)  . Diazepam     Nervous  . Fosamax  [Alendronate]   . Penicillins Swelling    and oral ulcers    Current Outpatient Prescriptions  Medication Sig Dispense Refill  . ALPRAZolam (XANAX) 0.5 MG tablet Take 1 tablet (0.5 mg total) by mouth at bedtime as needed for anxiety. 90 tablet 0  . amLODipine (NORVASC) 2.5 MG tablet Take 5 mg by mouth daily.     Marland Kitchen buPROPion (WELLBUTRIN SR) 150 MG 12 hr tablet Take 150 mg by mouth 2 (two) times daily.    . citalopram (CELEXA) 10 MG tablet TAKE 1 TABLET BY MOUTH EVERY DAY 90 tablet 0  . hydrochlorothiazide (HYDRODIURIL) 25 MG tablet TAKE 1  TABLET BY MOUTH EVERY DAY (Patient taking differently: TAKE 0.5 TABLET BY MOUTH EVERY DAY) 90 tablet 1  . metoprolol succinate (TOPROL-XL) 100 MG 24 hr tablet TAKE 1 TABLET BY MOUTH EVERY DAY 90 tablet 1  . MULTIPLE VITAMIN PO Take 1 tablet by mouth daily.    . ondansetron (ZOFRAN) 4 MG tablet Take 1 tablet (4 mg total) by mouth every 8 (eight) hours as needed for nausea or vomiting. 30 tablet 1  . tiZANidine (ZANAFLEX) 4 MG capsule Take 1  capsule by mouth 3 (three) times daily as needed.    Marland Kitchen tiZANidine (ZANAFLEX) 4 MG tablet TAKE 1 TABLET BY MOUTH 3 TIMES A DAY AS NEEDED 90 tablet 5  . traZODone (DESYREL) 100 MG tablet Take 100 mg by mouth at bedtime.     No current facility-administered medications for this visit.    OBJECTIVE: BP 116/79 mmHg  Pulse 59  Temp(Src) 98.1 F (36.7 C) (Tympanic)  Resp 18  Ht 5' 1.5" (1.562 m)  Wt 121 lb 0.5 oz (54.9 kg)  BMI 22.50 kg/m2   Body mass index is 22.5 kg/(m^2).:1 - Symptomatic but completely ambulatory  General: Well-developed, well-nourished, no acute distress. Eyes: Pink conjunctiva, anicteric sclera. HEENT: Normocephalic, moist mucous membranes, clear oropharnyx. Lungs: Clear to auscultation bilaterally. Heart: Regular rate and rhythm. No rubs, murmurs, or gallops. Musculoskeletal: No edema, cyanosis, or clubbing. Neuro: Alert, answering all questions appropriately. Cranial nerves grossly intact. Skin: No rashes or petechiae noted. Psych: Normal affect, although appears somewhat forgetful regarding the details of her medical history. Breasts: Right-no asymmetry, masses, discharge, no lymphadenopathy. Left-no asymmetry, masses, discharge, no lymphadenopathy.   LAB RESULTS:  No visits with results within 3 Day(s) from this visit. Latest known visit with results is:  Appointment on 01/20/2015  Component Date Value Ref Range Status  . Total Protein ELP 01/20/2015 6.5  6.0 - 8.5 g/dL Final  . Albumin ELP 01/20/2015 3.3  2.9 - 4.4 g/dL Final  . Alpha-1-Globulin 01/20/2015 0.3  0.0 - 0.4 g/dL Final  . Alpha-2-Globulin 01/20/2015 0.8  0.4 - 1.0 g/dL Final  . Beta Globulin 01/20/2015 1.1  0.7 - 1.3 g/dL Final  . Gamma Globulin 01/20/2015 1.0  0.4 - 1.8 g/dL Final  . M-Spike, % 01/20/2015 0.7* Not Observed g/dL Final  . SPE Interp. 01/20/2015 Comment   Final   Comment: (NOTE) The SPE pattern demonstrates a single peak (M-spike) in the gamma region which may represent  monoclonal protein. This peak may also be caused by circulating immune complexes, cryoglobulins, C-reactive protein, fibrinogen or hemolysis.  If clinically indicated, the presence of a monoclonal gammopathy may be confirmed by immuno- fixation, as well as an evaluation of the urine for the presence of Bence-Jones protein. Performed At: [facility] [place], [place] [id number] Lindon Romp MD PR:[id number]   . Comment 01/20/2015 Comment   Final   Comment: (NOTE) Protein electrophoresis scan will follow via computer, mail, or courier delivery.   Marland Kitchen GLOBULIN, TOTAL 01/20/2015 3.2  2.2 - 3.9 g/dL Corrected  . A/G Ratio 01/20/2015 1.0  0.7 - 1.7 Corrected  . WBC 01/20/2015 9.2  3.6 - 11.0 K/uL Final  . RBC 01/20/2015 4.42  3.80 - 5.20 MIL/uL Final  . Hemoglobin 01/20/2015 13.9  12.0 - 16.0 g/dL Final  . HCT 01/20/2015 40.8  35.0 - 47.0 % Final  . MCV 01/20/2015 92.3  80.0 - 100.0 fL Final  . MCH 01/20/2015 31.4  26.0 - 34.0 pg Final  .  MCHC 01/20/2015 34.0  32.0 - 36.0 g/dL Final  . RDW 01/20/2015 13.3  11.5 - 14.5 % Final  . Platelets 01/20/2015 281  150 - 440 K/uL Final  . Neutrophils Relative % 01/20/2015 70   Final  . Neutro Abs 01/20/2015 6.4  1.4 - 6.5 K/uL Final  . Lymphocytes Relative 01/20/2015 20   Final  . Lymphs Abs 01/20/2015 1.9  1.0 - 3.6 K/uL Final  . Monocytes Relative 01/20/2015 8   Final  . Monocytes Absolute 01/20/2015 0.7  0.2 - 0.9 K/uL Final  . Eosinophils Relative 01/20/2015 1   Final  . Eosinophils Absolute 01/20/2015 0.1  0 - 0.7 K/uL Final  . Basophils Relative 01/20/2015 1   Final  . Basophils Absolute 01/20/2015 0.1  0 - 0.1 K/uL Final  . Sodium 01/20/2015 136  135 - 145 mmol/L Final  . Potassium 01/20/2015 3.5  3.5 - 5.1 mmol/L Final  . Chloride 01/20/2015 102  101 - 111 mmol/L Final  . CO2 01/20/2015 27  22 - 32 mmol/L Final  . Glucose, Bld 01/20/2015 117* 65 - 99 mg/dL Final  . BUN 01/20/2015 10  6 - 20 mg/dL Final  .  Creatinine, Ser 01/20/2015 1.28* 0.44 - 1.00 mg/dL Final  . Calcium 01/20/2015 8.7* 8.9 - 10.3 mg/dL Final  . GFR calc non Af Amer 01/20/2015 42* >60 mL/min Final  . GFR calc Af Amer 01/20/2015 48* >60 mL/min Final   Comment: (NOTE) The eGFR has been calculated using the CKD EPI equation. This calculation has not been validated in all clinical situations. eGFR's persistently <60 mL/min signify possible Chronic Kidney Disease.   . Anion gap 01/20/2015 7  5 - 15 Final    STUDIES: No results found.  ASSESSMENT:  Monoclonal gammopathy  PLAN:   1. Monoclonal gammopathy. Initially seen in 2014 with 900 mg IgG lambda M- spike. M spike has been stable with normal serum free kappa to lambda ratio. CBC also appears to be normal. Creatinine, although mildly elevated, remains relatively stable. As such my suspicion for progression of monoclonal gammopathy to multiple myeloma is extremely low.  2. Bone pain-generalized body pain in this case could have multiple potential causes, such as osteoporosis, metastatic bone lesions, inflammatory arthritis or fibromyalgia. We will check inflammatory markers, request DEXA scan and bone survey.  3. Weight loss-patient had colonoscopy in 2014, which was normal. Physical exam does not suggest any masses in the breasts, but we requested mammogram to be performed. Also, we discovered that her alkaline phosphatase is significantly elevated today, along with total bilirubin with predominance of direct bilirubin, which may indicate the development of obstructive jaundice, which in the second of weight loss is suspicious for pancreatic cancer or cholangiocarcinoma. We will obtain ultrasound of the abdomen ASAP.  4. Hypokalemia-patient is on hydrochlorothiazide; so we requested her to stop hydrochlorothiazide and initiated potassium supplementation 40 mEq of potassium chloride a day for the next 7 days. We will have BMP rechecked either in our clinic or at the primary care  doctor's office.  5. Hyperuricemia-no clinical suspicion for gout, no hyperproliferative blood disorder, so could be secondary to hydrochlorothiazide. Currently uric acid level is normal.  Since her myeloma parameters, kidney function and calcium levels appear to be stable, we will see her on annual basis with labs performed 1 week prior to next appointment.   Patient expressed understanding and was in agreement with this plan. She also understands that She can call clinic any time with any  questions, concerns, or complaints.       Roxana Hires, MD   04/30/2015 1:01 PM

## 2015-05-01 ENCOUNTER — Ambulatory Visit
Admission: RE | Admit: 2015-05-01 | Discharge: 2015-05-01 | Disposition: A | Discharge status: Home / Self Care | Payer: Medicare Other | Source: Ambulatory Visit | Attending: Internal Medicine | Admitting: Internal Medicine

## 2015-05-01 ENCOUNTER — Telehealth: Payer: Self-pay | Admitting: *Deleted

## 2015-05-01 DIAGNOSIS — D472 Monoclonal gammopathy: Secondary | ICD-10-CM

## 2015-05-01 DIAGNOSIS — M810 Age-related osteoporosis without current pathological fracture: Secondary | ICD-10-CM | POA: Insufficient documentation

## 2015-05-01 DIAGNOSIS — Z1231 Encounter for screening mammogram for malignant neoplasm of breast: Secondary | ICD-10-CM | POA: Insufficient documentation

## 2015-05-01 DIAGNOSIS — Z13820 Encounter for screening for osteoporosis: Secondary | ICD-10-CM | POA: Diagnosis present

## 2015-05-01 LAB — C-REACTIVE PROTEIN: CRP: 3.9 mg/dL — AB (ref <1.0)

## 2015-05-01 NOTE — Telephone Encounter (Signed)
Called pt to let her know about liver enzymes were elevated and dr wanted to arrange u/s to see if there is something going on with liver.  Made arrangement for Friday am and pt states that she has had her son running her to so many places this week and now his wife is sick and he is taking care of her and she can't ask him to take her somewhere else and he already told her that he had things he had to take care of tom.  She was agreeable to Monday and had colette change the u/s to mon day 2/6 in afternoon arrive at 1:30 for 1:45 appt. Pt NPO after midnight the night before and she is agreeable to this plan

## 2015-05-02 ENCOUNTER — Ambulatory Visit: Payer: Medicare Other

## 2015-05-02 LAB — PROTEIN ELECTROPHORESIS, SERUM
A/G Ratio: 1 (ref 0.7–1.7)
ALBUMIN ELP: 3.5 g/dL (ref 2.9–4.4)
ALPHA-1-GLOBULIN: 0.4 g/dL (ref 0.0–0.4)
Alpha-2-Globulin: 0.9 g/dL (ref 0.4–1.0)
Beta Globulin: 1.3 g/dL (ref 0.7–1.3)
GLOBULIN, TOTAL: 3.6 g/dL (ref 2.2–3.9)
Gamma Globulin: 1 g/dL (ref 0.4–1.8)
M-SPIKE, %: 0.7 g/dL — AB
TOTAL PROTEIN ELP: 7.1 g/dL (ref 6.0–8.5)

## 2015-05-05 ENCOUNTER — Ambulatory Visit
Admission: RE | Admit: 2015-05-05 | Discharge: 2015-05-05 | Disposition: A | Discharge status: Home / Self Care | Payer: Medicare Other | Source: Ambulatory Visit | Attending: Internal Medicine | Admitting: Internal Medicine

## 2015-05-05 DIAGNOSIS — R945 Abnormal results of liver function studies: Principal | ICD-10-CM

## 2015-05-05 DIAGNOSIS — R7989 Other specified abnormal findings of blood chemistry: Secondary | ICD-10-CM

## 2015-05-06 ENCOUNTER — Ambulatory Visit
Admission: RE | Admit: 2015-05-06 | Discharge: 2015-05-06 | Disposition: A | Discharge status: Home / Self Care | Payer: Medicare Other | Source: Ambulatory Visit | Attending: Internal Medicine | Admitting: Internal Medicine

## 2015-05-06 ENCOUNTER — Other Ambulatory Visit: Payer: Self-pay | Admitting: *Deleted

## 2015-05-06 DIAGNOSIS — R7989 Other specified abnormal findings of blood chemistry: Secondary | ICD-10-CM | POA: Diagnosis not present

## 2015-05-06 DIAGNOSIS — K8020 Calculus of gallbladder without cholecystitis without obstruction: Secondary | ICD-10-CM | POA: Insufficient documentation

## 2015-05-06 DIAGNOSIS — R935 Abnormal findings on diagnostic imaging of other abdominal regions, including retroperitoneum: Secondary | ICD-10-CM

## 2015-05-06 DIAGNOSIS — K831 Obstruction of bile duct: Secondary | ICD-10-CM | POA: Diagnosis not present

## 2015-05-07 ENCOUNTER — Inpatient Hospital Stay: Payer: Medicare Other

## 2015-05-07 DIAGNOSIS — M898X9 Other specified disorders of bone, unspecified site: Secondary | ICD-10-CM | POA: Diagnosis not present

## 2015-05-07 DIAGNOSIS — D472 Monoclonal gammopathy: Secondary | ICD-10-CM

## 2015-05-07 DIAGNOSIS — E876 Hypokalemia: Secondary | ICD-10-CM | POA: Diagnosis not present

## 2015-05-07 DIAGNOSIS — E790 Hyperuricemia without signs of inflammatory arthritis and tophaceous disease: Secondary | ICD-10-CM | POA: Diagnosis not present

## 2015-05-07 DIAGNOSIS — Z85828 Personal history of other malignant neoplasm of skin: Secondary | ICD-10-CM | POA: Diagnosis not present

## 2015-05-07 DIAGNOSIS — Z79899 Other long term (current) drug therapy: Secondary | ICD-10-CM | POA: Diagnosis not present

## 2015-05-07 LAB — POTASSIUM: Potassium: 3.8 mmol/L (ref 3.5–5.1)

## 2015-05-08 ENCOUNTER — Telehealth: Payer: Self-pay | Admitting: *Deleted

## 2015-05-08 LAB — KAPPA/LAMBDA LIGHT CHAINS
Kappa free light chain: 25.82 mg/L — ABNORMAL HIGH (ref 3.30–19.40)
Kappa, lambda light chain ratio: 0.4 (ref 0.26–1.65)
Lambda free light chains: 64.85 mg/L — ABNORMAL HIGH (ref 5.71–26.30)

## 2015-05-08 NOTE — Telephone Encounter (Signed)
Contacted pt after she spoke to Marion about results.  I went over the results again and explained why dr Rudean Hitt wanted her to have MRI/MRCP on 05/13/15.  She understands and is agreeable to the test. She was given instructions on where and when and NPO x 4 hours at [facility].

## 2015-05-08 NOTE — Telephone Encounter (Signed)
The patient requested that these results be forwarded to her primary care provider Dr. Carolin Coy. She has an appointment on Monday with her primary care provider. I did route these results to her PMD through [facility]. The patient is concerned that her u/s demonstrated gallstones. I explained that Dr. Rudean Hitt entered an order for an MR of abdomen to further work up. She states that she is not aware of this appointment and has not been contacted regarding any further testing. I explained that I would get with Dr. Leighton Ruff nurse to contact her to discuss her concerns.  Approximately 25 minutes spent via telephone discussing the patient's concerns and results.

## 2015-05-08 NOTE — Telephone Encounter (Signed)
Patient called Dr. Aletha Halim nurse for lab results. I explained to her that she sees Dr. Nestor Ramp, however, I will be glad to assist her. Results for labs, mammogram, bone density and u/s of abdomen reviewed with patient. I explained to her that she needs adequate calcium/vitamin D intake to prevent osteoporosis. Her mammogram was within normal limits.

## 2015-05-12 ENCOUNTER — Ambulatory Visit (INDEPENDENT_AMBULATORY_CARE_PROVIDER_SITE_OTHER): Payer: Medicare Other | Admitting: Internal Medicine

## 2015-05-12 ENCOUNTER — Encounter: Payer: Self-pay | Admitting: Internal Medicine

## 2015-05-12 VITALS — BP 136/85 | HR 64 | Ht 61.5 in | Wt 121.6 lb

## 2015-05-12 DIAGNOSIS — M810 Age-related osteoporosis without current pathological fracture: Secondary | ICD-10-CM | POA: Diagnosis not present

## 2015-05-12 DIAGNOSIS — R7989 Other specified abnormal findings of blood chemistry: Secondary | ICD-10-CM

## 2015-05-12 DIAGNOSIS — I10 Essential (primary) hypertension: Secondary | ICD-10-CM | POA: Diagnosis not present

## 2015-05-12 DIAGNOSIS — K829 Disease of gallbladder, unspecified: Secondary | ICD-10-CM

## 2015-05-12 DIAGNOSIS — N289 Disorder of kidney and ureter, unspecified: Secondary | ICD-10-CM | POA: Diagnosis not present

## 2015-05-12 DIAGNOSIS — F324 Major depressive disorder, single episode, in partial remission: Secondary | ICD-10-CM

## 2015-05-12 DIAGNOSIS — R945 Abnormal results of liver function studies: Secondary | ICD-10-CM

## 2015-05-12 NOTE — Progress Notes (Signed)
Date:  05/12/2015   Name:  Shannon Le   DOB:  [birth year]   MRN:  [id number]   Chief Complaint: Follow-up and Hypertension Hypertension This is a chronic problem. The current episode started more than 1 year ago. The problem is unchanged. The problem is controlled. Pertinent negatives include no chest pain, headaches, palpitations or shortness of breath. Past treatments include calcium channel blockers and beta blockers.   Right-sided mid back pain - patient has started with some right-sided mid back pain for a number of months. Her appetite was decreased and she was under more stress caring for her disabled sister. During this time she consumed less than lost about 30 pounds. When she mentioned the back pain to her hematologist he ordered an ultrasound which showed gallstones and sludge. She now has another test planned to further evaluate in the setting of elevated liver functions.  Monoclonal gammopathy - seen recently by hematology. SPEP shows a slight monoclonal spike but hematology had low suspicion of this was multiple myeloma. Because of her complaint of back pain he ordered a DEXA and a bone scan. Patient states she's only had a DEXA, the staff said that she did not need any other bone test. Reviewing his orders he did indeed order both tests and I have encouraged her to discuss with him proceeding with the bone scan.  Review of Systems  Constitutional: Positive for unexpected weight change. Negative for fever, diaphoresis and fatigue.  Respiratory: Negative for chest tightness, shortness of breath and wheezing.   Cardiovascular: Negative for chest pain, palpitations and leg swelling.  Gastrointestinal: Positive for abdominal pain. Negative for nausea, diarrhea and blood in stool.  Genitourinary: Negative for dysuria.  Neurological: Negative for dizziness, tremors and headaches.  Psychiatric/Behavioral: Negative for sleep disturbance and dysphoric mood.    Patient Active  Problem List   Diagnosis Date Noted  . Elevated liver function tests 09/21/2014  . Monoclonal gammopathy of undetermined significance 09/19/2014  . Acute thoracic back pain 07/23/2014  . Dyslipidemia 07/23/2014  . Essential (primary) hypertension 07/23/2014  . Anxiety, generalized 07/23/2014  . Decreased potassium in the blood 07/23/2014  . Depression, major, single episode, in partial remission ([place]) 07/23/2014  . OP (osteoporosis) 07/23/2014  . Insomnia, psychophysiological 07/23/2014  . Impaired renal function 07/23/2014    Prior to Admission medications   Medication Sig Start Date End Date Taking? Authorizing Provider  ALPRAZolam Duanne Moron) 0.5 MG tablet Take 1 tablet (0.5 mg total) by mouth at bedtime as needed for anxiety. 02/17/15  Yes Glean Hess, MD  amLODipine (NORVASC) 2.5 MG tablet Take 5 mg by mouth daily.    Yes Historical Provider, MD  Biotin 5000 MCG CAPS Take 1 capsule by mouth daily.   Yes Historical Provider, MD  buPROPion (WELLBUTRIN SR) 150 MG 12 hr tablet Take 150 mg by mouth 2 (two) times daily.   Yes Historical Provider, MD  Cholecalciferol (VITAMIN D3) 5000 units CAPS Take 1 capsule by mouth daily.   Yes Historical Provider, MD  citalopram (CELEXA) 10 MG tablet TAKE 1 TABLET BY MOUTH EVERY DAY 04/01/15  Yes Glean Hess, MD  metoprolol succinate (TOPROL-XL) 100 MG 24 hr tablet TAKE 1 TABLET BY MOUTH EVERY DAY 11/18/14  Yes Glean Hess, MD  MULTIPLE VITAMIN PO Take 1 tablet by mouth daily.   Yes Historical Provider, MD  ondansetron (ZOFRAN) 4 MG tablet Take 1 tablet (4 mg total) by mouth every 8 (eight) hours as needed for nausea  or vomiting. 09/19/14  Yes Glean Hess, MD  potassium chloride SA (K-DUR,KLOR-CON) 20 MEQ tablet Take 1 tablet (20 mEq total) by mouth 2 (two) times daily. Patient not taking: Reported on 05/12/2015 04/30/15   Roxana Hires, MD  tiZANidine (ZANAFLEX) 4 MG capsule Take 1 capsule by mouth 3 (three) times daily as needed. 04/23/14   Yes Historical Provider, MD  traZODone (DESYREL) 100 MG tablet Take 100 mg by mouth at bedtime.   Yes Historical Provider, MD  vitamin E 1000 UNIT capsule Take 1,000 Units by mouth daily.   Yes Historical Provider, MD    Allergies  Allergen Reactions  . Codeine Shortness Of Breath  . Alendronate Sodium Other (See Comments)    unknown  . Atorvastatin Diarrhea and Nausea And Vomiting  . Bee Venom Other (See Comments)  . Diazepam     Nervous  . Fosamax  [Alendronate]   . Penicillins Swelling    and oral ulcers    Past Surgical History  Procedure Laterality Date  . Melanoma excision  05/2013    mid back Dr. Aubery Lapping  . Carpal tunnel release Right     and ganglion  . Partial hysterectomy      endometriosis  . Shoulder arthroscopy w/ acromial repair Right   . Abdominal hysterectomy      partial    Social History  Substance Use Topics  . Smoking status: Never Smoker   . Smokeless tobacco: None  . Alcohol Use: No     Medication list has been reviewed and updated.   Physical Exam  Constitutional: She is oriented to person, place, and time. She appears well-developed. No distress.  HENT:  Head: Normocephalic and atraumatic.  Neck: Normal range of motion. Neck supple.  Cardiovascular: Normal rate, regular rhythm and normal heart sounds.   Pulmonary/Chest: Effort normal and breath sounds normal. No respiratory distress.  Abdominal: Soft. She exhibits no distension. There is tenderness.  Musculoskeletal: Normal range of motion. She exhibits no edema or tenderness.  Neurological: She is alert and oriented to person, place, and time.  Skin: Skin is warm and dry. No rash noted.  Psychiatric: She has a normal mood and affect. Her behavior is normal. Thought content normal.  Nursing note and vitals reviewed.   BP 168/72 mmHg  Pulse 64  Ht 5' 1.5" (1.562 m)  Wt 121 lb 9.6 oz (55.157 kg)  BMI 22.61 kg/m2  Assessment and Plan: 1. Essential (primary)  hypertension controlled  2. OP (osteoporosis) DEXA showed OP at the hip and osteopenia at the spine She will continue Vitamin D but refuses to take calcium due to the side effect of constipation  3. Impaired renal function Fairly stable with GFR 42  4. Depression, major, single episode, in partial remission ([place]) Doing well on Bupropion and trazodone  5. Gall bladder disease Causing elevated liver function tests, right sides pain and weight loss Further testing planned - will likely need Cholecystectomy   Halina Maidens, MD [facility]  05/12/2015

## 2015-05-13 ENCOUNTER — Ambulatory Visit
Admission: RE | Admit: 2015-05-13 | Discharge: 2015-05-13 | Disposition: A | Discharge status: Home / Self Care | Payer: Medicare Other | Source: Ambulatory Visit | Attending: Internal Medicine | Admitting: Internal Medicine

## 2015-05-13 DIAGNOSIS — K8020 Calculus of gallbladder without cholecystitis without obstruction: Secondary | ICD-10-CM | POA: Insufficient documentation

## 2015-05-13 DIAGNOSIS — R935 Abnormal findings on diagnostic imaging of other abdominal regions, including retroperitoneum: Secondary | ICD-10-CM | POA: Diagnosis not present

## 2015-05-13 MED ORDER — GADOBENATE DIMEGLUMINE 529 MG/ML IV SOLN
15.0000 mL | Freq: Once | INTRAVENOUS | Status: AC | PRN
  Administered 2015-05-13: 5 mL via INTRAVENOUS

## 2015-05-20 ENCOUNTER — Telehealth: Payer: Self-pay | Admitting: *Deleted

## 2015-05-20 NOTE — Telephone Encounter (Signed)
Pt called and wanted results.  I spoke to Dr. Rudean Hitt and she has multiple gallstones and she would need surgical consult.  i have called pt. And left message to let her know that pt has gallstones and she needs to be eval. With surgery to see if she is candidate for surgery or if there is other options.  Will await her to call me back with her decision.

## 2015-05-20 NOTE — Telephone Encounter (Signed)
Called pt to llet her know that I got an appt in afternoon for [facility] at [facility]. 2/28 2:15.  Dr. Adonis Huguenin.  I have faxed info to their office for md review.

## 2015-05-22 ENCOUNTER — Other Ambulatory Visit: Payer: Self-pay | Admitting: Internal Medicine

## 2015-05-23 ENCOUNTER — Ambulatory Visit: Payer: Medicare Other

## 2015-05-27 ENCOUNTER — Encounter: Payer: Self-pay | Admitting: General Surgery

## 2015-05-27 ENCOUNTER — Ambulatory Visit (INDEPENDENT_AMBULATORY_CARE_PROVIDER_SITE_OTHER): Payer: Medicare Other | Admitting: General Surgery

## 2015-05-27 VITALS — BP 102/48 | HR 56 | Temp 97.6°F | Ht 62.0 in | Wt 120.0 lb

## 2015-05-27 DIAGNOSIS — K829 Disease of gallbladder, unspecified: Secondary | ICD-10-CM | POA: Diagnosis not present

## 2015-05-27 NOTE — Patient Instructions (Signed)
If you start getting sick: vomiting, nausea and fever, please go to the emergency room. Your surgery will be on Thursday June 12, 2015. Please be prepared to stay at the hospital.

## 2015-05-28 NOTE — Progress Notes (Signed)
Patient ID: Shannon Le, female   DOB: [birth year], 70 y.o.   MRN: [id number]  CC: weight loss and jaundice  HPI Shannon Le is a 70 y.o. female presents to clinic for evaluation of weight loss and jaundice. She's had an extensive workup to include multiple labs, ultrasound, MRCP. She has been diagnosed as having gallstones and something obstructing her common bile duct. Patient also states over the last year she lost 60-70 pounds unintentionally. She denies any abdominal pain but does have some right flank the back pain. She denies any current nausea or vomiting but does have a decreased appetite. She denies any diarrhea or constipation either. She has a significant history of previous surgery for melanoma, a history of a monoclonal gammopathy of undetermined significance, hypertension. Her primary complaint is of weight loss secondary to lack of appetite. She denies any fevers or chills, chest pain, shortness of breath.  HPI  Past Medical History  Diagnosis Date  . Monoclonal gammopathy of undetermined significance 2014    [place]  . MGUS (monoclonal gammopathy of unknown significance)   . Hypertension   . 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 Melanoma excision  05/2013    mid back Dr. Aubery Lapping  . Carpal tunnel release Right     and ganglion  . Partial hysterectomy      endometriosis  . Shoulder arthroscopy w/ acromial repair Right   . Abdominal hysterectomy      partial    Family History  Problem Relation Age of Onset  . Mental illness Other   . Diabetes Mother   . Diabetes Brother   . Colon cancer Brother   . Stroke Brother     Social History Social History  Substance Use Topics  . Smoking status: Never Smoker   . Smokeless tobacco: None  . Alcohol Use: No    Allergies  Allergen Reactions  . Codeine Shortness Of Breath  . Alendronate Sodium Other (See Comments)     unknown  . Atorvastatin Diarrhea and Nausea And Vomiting  . Bee Venom Other (See Comments)  . Diazepam     Nervous  . Fosamax  [Alendronate]   . Penicillins Swelling    and oral ulcers    Current Outpatient Prescriptions  Medication Sig Dispense Refill  . ALPRAZolam (XANAX) 0.5 MG tablet TAKE 1 TABLET BY MOUTH EVERY NIGHT AT BEDTIME AS NEEDED 30 tablet 5  . amLODipine (NORVASC) 2.5 MG tablet Take 5 mg by mouth daily.     . Biotin 5000 MCG CAPS Take 1 capsule by mouth daily.    Marland Kitchen buPROPion (WELLBUTRIN SR) 150 MG 12 hr tablet Take 150 mg by mouth 2 (two) times daily.    . Cholecalciferol (VITAMIN D3) 5000 units CAPS Take 1 capsule by mouth daily.    . citalopram (CELEXA) 10 MG tablet TAKE 1 TABLET BY MOUTH EVERY DAY 90 tablet 0  . diphenhydrAMINE (BENADRYL) 25 mg capsule Take 25 mg by mouth every 6 (six) hours as needed.    . metoprolol succinate (TOPROL-XL) 100 MG 24 hr tablet TAKE 1 TABLET BY MOUTH EVERY DAY 90 tablet 3  . MULTIPLE VITAMIN PO Take 1 tablet by mouth daily.    . ondansetron (ZOFRAN) 4 MG tablet Take 1 tablet (4 mg total) by mouth every 8 (eight) hours as needed for nausea or vomiting. 30 tablet 1  . potassium chloride  SA (K-DUR,KLOR-CON) 20 MEQ tablet Take 1 tablet (20 mEq total) by mouth 2 (two) times daily. 14 tablet 0  . tiZANidine (ZANAFLEX) 4 MG capsule Take 1 capsule by mouth 3 (three) times daily as needed.    . traZODone (DESYREL) 100 MG tablet Take 100 mg by mouth at bedtime.    . vitamin E 1000 UNIT capsule Take 1,000 Units by mouth daily.     No current facility-administered medications for this visit.     Review of Systems A multi-point review of systems was asked and was negative except for the findings documented in the history of present illness  Physical Exam Blood pressure 102/48, pulse 56, temperature 97.6 F (36.4 C), temperature source Oral, height 5\' 2"  (1.575 m), weight 54.432 kg (120 lb). CONSTITUTIONAL: No acute distress. EYES: Pupils  are equal, round, and reactive to light, eyes or obviously jaundiced. EARS, NOSE, MOUTH AND THROAT: The oropharynx is clear. The oral mucosa is jaundiced and moist. Hearing is intact to voice. LYMPH NODES:  Lymph nodes in the neck are normal. RESPIRATORY:  Lungs are clear. There is normal respiratory effort, with equal breath sounds bilaterally, and without pathologic use of accessory muscles. CARDIOVASCULAR: Heart is regular without murmurs, gallops, or rubs. GI: The abdomen is soft, nontender, and nondistended. There are no palpable masses. There is no hepatosplenomegaly. There are normal bowel sounds in all quadrants. GU: Rectal deferred.   MUSCULOSKELETAL: Normal muscle strength and tone. No cyanosis or edema.   SKIN: Turgor is good and there are no pathologic skin lesions or ulcers. NEUROLOGIC: Motor and sensation is grossly normal. Cranial nerves are grossly intact. PSYCH:  Oriented to person, place and time. Affect is normal.  Data Reviewed Patient's images and labs reviewed. Ultrasound and MRCP clearly show gallstones within the gallbladder. On MRCP there is dilatation of the ventral packed off and there is an abrupt cut off at the junction between the cystic duct and the common duct. There is no visible MRCP flow in either duct at its junction. Labs are concerning for hyperbilirubinemia at 3.9 as well as elevated alkaline phosphatase at 739. She's got chronic kidney dysfunction with a creatinine of 1.3 cm recent lab draw. I have personally reviewed the patient's imaging, laboratory findings and medical records.    Assessment    70 year old female with weight loss and painless jaundice    Plan    71 year old female with gallstones and possible stone within her cystic duct. This is possibly the cause of her weight loss and jaundice. Discussed with her the possibility that something other than the stone is obstructing her bile ducts and that this is the case removing her gallbladder may  or may not improve her symptoms. Given this, discussed the treatment options the patient to include cholecystectomy with cholangiogram, ERCP, combination of blood. Patient voiced understand and wish to proceed with a laparoscopic cholecystectomy first. Discussed with her that she should be prepared to potentially be admitted after the surgery if her ducts are unable to be cleared via cholangiogram. Patient voiced understanding and wishes to proceed. I discussed the procedure in detail.  The patient was given Neurosurgeon.  We discussed the risks and benefits of a laparoscopic cholecystectomy and possible cholangiogram including, but not limited to bleeding, infection, injury to surrounding structures such as the intestine or liver, bile leak, retained gallstones, need to convert to an open procedure, prolonged diarrhea, blood clots such as  DVT, common bile duct injury, anesthesia risks, and possible need  for additional procedures.  The likelihood of improvement in symptoms and return to the patient's normal status is good. We discussed the typical post-operative recovery course. Plan for surgery on 06/12/2015      Time spent with the patient was 60 minutes, with more than 50% of the time spent in face-to-face education, counseling and care coordination.     Clayburn Pert, MD FACS General Surgeon 05/28/2015, 9:21 AM

## 2015-05-29 ENCOUNTER — Telehealth: Payer: Self-pay | Admitting: General Surgery

## 2015-05-29 NOTE — Telephone Encounter (Signed)
Pt advised of pre op date/time and sx date. Sx: 06/12/15 with Dr Gabrielle Dare cholecystectomy with IOC. Pre op: 06/02/15 @ 1:30pm--Office.   Patient made aware to call [phone], between 1-3:00pm the day before surgery, to find out what time to arrive.

## 2015-06-02 ENCOUNTER — Ambulatory Visit
Admission: RE | Admit: 2015-06-02 | Discharge: 2015-06-02 | Disposition: A | Discharge status: Home / Self Care | Payer: Medicare Other | Source: Ambulatory Visit | Attending: General Surgery | Admitting: General Surgery

## 2015-06-02 ENCOUNTER — Encounter
Admission: RE | Admit: 2015-06-02 | Discharge: 2015-06-02 | Disposition: A | Discharge status: Home / Self Care | Payer: Medicare Other | Source: Ambulatory Visit | Attending: General Surgery | Admitting: General Surgery

## 2015-06-02 DIAGNOSIS — Z01818 Encounter for other preprocedural examination: Secondary | ICD-10-CM | POA: Diagnosis not present

## 2015-06-02 DIAGNOSIS — Z01810 Encounter for preprocedural cardiovascular examination: Secondary | ICD-10-CM | POA: Insufficient documentation

## 2015-06-02 HISTORY — DX: Chronic kidney disease, unspecified: N18.9

## 2015-06-02 HISTORY — DX: Depression, unspecified: F32.A

## 2015-06-02 HISTORY — DX: Major depressive disorder, single episode, unspecified: F32.9

## 2015-06-02 LAB — APTT: APTT: 32 s (ref 24–36)

## 2015-06-02 LAB — COMPREHENSIVE METABOLIC PANEL
ALK PHOS: 713 U/L — AB (ref 38–126)
ALT: 57 U/L — ABNORMAL HIGH (ref 14–54)
AST: 80 U/L — AB (ref 15–41)
Albumin: 3.5 g/dL (ref 3.5–5.0)
Anion gap: 9 (ref 5–15)
BILIRUBIN TOTAL: 7 mg/dL — AB (ref 0.3–1.2)
BUN: 18 mg/dL (ref 6–20)
CALCIUM: 10 mg/dL (ref 8.9–10.3)
CO2: 22 mmol/L (ref 22–32)
Chloride: 107 mmol/L (ref 101–111)
Creatinine, Ser: 0.87 mg/dL (ref 0.44–1.00)
GFR calc Af Amer: >60 mL/min (ref >60)
GLUCOSE: 97 mg/dL (ref 65–99)
POTASSIUM: 4 mmol/L (ref 3.5–5.1)
Sodium: 138 mmol/L (ref 135–145)
TOTAL PROTEIN: 7.7 g/dL (ref 6.5–8.1)

## 2015-06-02 LAB — CBC WITH DIFFERENTIAL/PLATELET
BASOS ABS: 0.1 10*3/uL (ref 0–0.1)
BASOS PCT: 1 %
EOS PCT: 1 %
Eosinophils Absolute: 0.1 10*3/uL (ref 0–0.7)
HEMATOCRIT: 32.9 % — AB (ref 35.0–47.0)
Hemoglobin: 11 g/dL — ABNORMAL LOW (ref 12.0–16.0)
LYMPHS PCT: 15 %
Lymphs Abs: 1.5 10*3/uL (ref 1.0–3.6)
MCH: 32.5 pg (ref 26.0–34.0)
MCHC: 33.3 g/dL (ref 32.0–36.0)
MCV: 97.3 fL (ref 80.0–100.0)
MONO ABS: 0.8 10*3/uL (ref 0.2–0.9)
Monocytes Relative: 8 %
NEUTROS ABS: 7.5 10*3/uL — AB (ref 1.4–6.5)
Neutrophils Relative %: 75 %
PLATELETS: 290 10*3/uL (ref 150–440)
RBC: 3.38 MIL/uL — AB (ref 3.80–5.20)
RDW: 14.9 % — AB (ref 11.5–14.5)
WBC: 10 10*3/uL (ref 3.6–11.0)

## 2015-06-02 LAB — PROTIME-INR
INR: 1.14
PROTHROMBIN TIME: 14.8 s (ref 11.4–15.0)

## 2015-06-02 NOTE — Patient Instructions (Signed)
  Your procedure is scheduled on: Thursday June 12, 2015. Report to Same Day Surgery. To find out your arrival time please call [phone] between 1PM - 3PM on Wednesday June 11, 2015.  Remember: Instructions that are not followed completely may result in serious medical risk, up to and including death, or upon the discretion of your surgeon and anesthesiologist your surgery may need to be rescheduled.    _x___ 1. Do not eat food or drink liquids after midnight. No gum chewing or hard candies.     ____ 2. No Alcohol for 24 hours before or after surgery.   ____ 3. Bring all medications with you on the day of surgery if instructed.    __x__ 4. Notify your doctor if there is any change in your medical condition     (cold, fever, infections).     Do not wear jewelry, make-up, hairpins, clips or nail polish.  Do not wear lotions, powders, or perfumes. You may wear deodorant.  Do not shave 48 hours prior to surgery. Men may shave face and neck.  Do not bring valuables to the hospital.    [facility] is not responsible for any belongings or valuables.               Contacts, dentures or bridgework may not be worn into surgery.  Leave your suitcase in the car. After surgery it may be brought to your room.  For patients admitted to the hospital, discharge time is determined by your treatment team.   Patients discharged the day of surgery will not be allowed to drive home.    Please read over the following fact sheets that you were given:   [facility] Preparing for Surgery  _x___ Take these medicines the morning of surgery with A SIP OF WATER:    1. metoprolol succinate (TOPROL-XL)     ____ Fleet Enema (as directed)   __x__ Use CHG Soap as directed  ____ Use inhalers on the day of surgery  ____ Stop metformin 2 days prior to surgery    ____ Take 1/2 of usual insulin dose the night before surgery and none on the morning of surgery.   ____ Stop Coumadin/Plavix/aspirin does  not apply.  __x__ Stop Anti-inflammatories on does not apply.  OK to take Tylenol for pain.   __x__ Stop supplements Biotin and vitamins until after surgery.    ____ Bring C-Pap to the hospital.

## 2015-06-02 NOTE — Pre-Procedure Instructions (Signed)
Spoke with Melissa Dr. Oneal Deputy nurse regarding request for medical clearance related to change in EKG that was faxed a few minutes ago.

## 2015-06-02 NOTE — Pre-Procedure Instructions (Signed)
EKG done today has changes compared to EKG done 04/30/2010.  Medical clearance sent to Dr. Army Melia copy sent to Dr.Woodham's office, spoke with Angie at Dr. Reginal Lutes office regarding medical clearance request.

## 2015-06-03 ENCOUNTER — Encounter: Payer: Self-pay | Admitting: Internal Medicine

## 2015-06-03 ENCOUNTER — Ambulatory Visit (INDEPENDENT_AMBULATORY_CARE_PROVIDER_SITE_OTHER): Payer: Medicare Other | Admitting: Internal Medicine

## 2015-06-03 VITALS — BP 120/68 | HR 48 | Ht 62.0 in | Wt 117.4 lb

## 2015-06-03 DIAGNOSIS — R001 Bradycardia, unspecified: Secondary | ICD-10-CM

## 2015-06-03 DIAGNOSIS — S96912A Strain of unspecified muscle and tendon at ankle and foot level, left foot, initial encounter: Secondary | ICD-10-CM | POA: Diagnosis not present

## 2015-06-03 DIAGNOSIS — I447 Left bundle-branch block, unspecified: Secondary | ICD-10-CM | POA: Diagnosis not present

## 2015-06-03 DIAGNOSIS — Z01818 Encounter for other preprocedural examination: Secondary | ICD-10-CM

## 2015-06-03 NOTE — Patient Instructions (Signed)
Ankle Sprain  An ankle sprain is an injury to the strong, fibrous tissues (ligaments) that hold the bones of your ankle joint together.   CAUSES  An ankle sprain is usually caused by a fall or by twisting your ankle. Ankle sprains most commonly occur when you step on the outer edge of your foot, and your ankle turns inward. People who participate in sports are more prone to these types of injuries.   SYMPTOMS    Pain in your ankle. The pain may be present at rest or only when you are trying to stand or walk.   Swelling.   Bruising. Bruising may develop immediately or within 1 to 2 days after your injury.   Difficulty standing or walking, particularly when turning corners or changing directions.  DIAGNOSIS   Your caregiver will ask you details about your injury and perform a physical exam of your ankle to determine if you have an ankle sprain. During the physical exam, your caregiver will press on and apply pressure to specific areas of your foot and ankle. Your caregiver will try to move your ankle in certain ways. An X-ray exam may be done to be sure a bone was not broken or a ligament did not separate from one of the bones in your ankle (avulsion fracture).   TREATMENT   Certain types of braces can help stabilize your ankle. Your caregiver can make a recommendation for this. Your caregiver may recommend the use of medicine for pain. If your sprain is severe, your caregiver may refer you to a surgeon who helps to restore function to parts of your skeletal system (orthopedist) or a physical therapist.  HOME CARE INSTRUCTIONS    Apply ice to your injury for 1-2 days or as directed by your caregiver. Applying ice helps to reduce inflammation and pain.    Put ice in a plastic bag.    Place a towel between your skin and the bag.    Leave the ice on for 15-20 minutes at a time, every 2 hours while you are awake.   Only take over-the-counter or prescription medicines for pain, discomfort, or fever as directed by  your caregiver.   Elevate your injured ankle above the level of your heart as much as possible for 2-3 days.   If your caregiver recommends crutches, use them as instructed. Gradually put weight on the affected ankle. Continue to use crutches or a cane until you can walk without feeling pain in your ankle.   If you have a plaster splint, wear the splint as directed by your caregiver. Do not rest it on anything harder than a pillow for the first 24 hours. Do not put weight on it. Do not get it wet. You may take it off to take a shower or bath.   You may have been given an elastic bandage to wear around your ankle to provide support. If the elastic bandage is too tight (you have numbness or tingling in your foot or your foot becomes cold and blue), adjust the bandage to make it comfortable.   If you have an air splint, you may blow more air into it or let air out to make it more comfortable. You may take your splint off at night and before taking a shower or bath. Wiggle your toes in the splint several times per day to decrease swelling.  SEEK MEDICAL CARE IF:    You have rapidly increasing bruising or swelling.   Your toes feel   extremely cold or you lose feeling in your foot.   Your pain is not relieved with medicine.  SEEK IMMEDIATE MEDICAL CARE IF:   Your toes are numb or blue.   You have severe pain that is increasing.  MAKE SURE YOU:    Understand these instructions.   Will watch your condition.   Will get help right away if you are not doing well or get worse.     This information is not intended to replace advice given to you by your health care provider. Make sure you discuss any questions you have with your health care provider.     Document Released: 03/15/2005 Document Revised: 04/05/2014 Document Reviewed: 03/27/2011  [employer] Interactive Patient Education 2016 [employer]

## 2015-06-03 NOTE — Progress Notes (Signed)
Date:  06/03/2015   Name:  Shannon Le   DOB:  05-11-1945   MRN:  [id number]   Chief Complaint: Medical Clearance and Edema Patient is here to followup for abnormal EKG done prior to surgery.  She has a cholecystectomy scheduled due to stones, jaundice and weight loss.  An EKG in 2012 was sinus brady but other wise normal.  One done recently was poor quality and interpreted as LBBB. She has HTN and is treated with amlodipine and a beta blocker. She does not smoke.  Her cholesterol is acceptable.  She does have family history of CAD. Ankle pain - she noted pain last week after doing some extra walking.  It has been swollen on the outer ankle.  It is painful to weight bearing.  It has improved with elevation.  Review of Systems  Constitutional: Positive for unexpected weight change. Negative for fever, chills and fatigue.  Respiratory: Negative for cough, chest tightness and shortness of breath.   Cardiovascular: Positive for palpitations. Negative for chest pain and leg swelling.  Gastrointestinal: Negative for abdominal pain and diarrhea.  Musculoskeletal: Positive for joint swelling and arthralgias. Negative for myalgias.    Patient Active Problem List   Diagnosis Date Noted  . Gall bladder disease 05/12/2015  . Elevated liver function tests 09/21/2014  . Monoclonal gammopathy of undetermined significance 09/19/2014  . Acute thoracic back pain 07/23/2014  . Dyslipidemia 07/23/2014  . Essential (primary) hypertension 07/23/2014  . Anxiety, generalized 07/23/2014  . Decreased potassium in the blood 07/23/2014  . Depression, major, single episode, in partial remission ([place]) 07/23/2014  . OP (osteoporosis) 07/23/2014  . Insomnia, psychophysiological 07/23/2014  . Impaired renal function 07/23/2014    Prior to Admission medications   Medication Sig Start Date End Date Taking? Authorizing Provider  ALPRAZolam Duanne Moron) 0.5 MG tablet TAKE 1 TABLET BY MOUTH EVERY NIGHT AT BEDTIME AS  NEEDED 05/22/15  Yes Glean Hess, MD  amLODipine (NORVASC) 2.5 MG tablet Take 5 mg by mouth at bedtime.    Yes Historical Provider, MD  Biotin 5000 MCG CAPS Take 1 capsule by mouth at bedtime.    Yes Historical Provider, MD  buPROPion (WELLBUTRIN SR) 150 MG 12 hr tablet Take 300 mg by mouth at bedtime.    Yes Historical Provider, MD  Cholecalciferol (VITAMIN D3) 5000 units CAPS Take 1 capsule by mouth at bedtime.    Yes Historical Provider, MD  citalopram (CELEXA) 10 MG tablet TAKE 1 TABLET BY MOUTH EVERY DAY Patient taking differently: TAKE 1 TABLET BY MOUTH EVERY DAY in am 04/01/15  Yes Glean Hess, MD  diphenhydrAMINE (BENADRYL) 25 mg capsule Take 25 mg by mouth every 6 (six) hours as needed.   Yes Historical Provider, MD  metoprolol succinate (TOPROL-XL) 100 MG 24 hr tablet TAKE 1 TABLET BY MOUTH EVERY DAY Patient taking differently: TAKE 1 TABLET BY MOUTH EVERY DAY at supper 05/22/15  Yes Glean Hess, MD  ondansetron (ZOFRAN) 4 MG tablet Take 1 tablet (4 mg total) by mouth every 8 (eight) hours as needed for nausea or vomiting. 09/19/14  Yes Glean Hess, MD  tiZANidine (ZANAFLEX) 4 MG capsule Take 1 capsule by mouth 3 (three) times daily as needed. 04/23/14  Yes Historical Provider, MD  traZODone (DESYREL) 100 MG tablet Take 100 mg by mouth at bedtime.   Yes Historical Provider, MD  vitamin E 1000 UNIT capsule Take 1,000 Units by mouth every morning.    Yes Historical Provider,  MD    Allergies  Allergen Reactions  . Codeine Shortness Of Breath  . Alendronate Sodium Other (See Comments)    unknown  . Atorvastatin Diarrhea and Nausea And Vomiting  . Bee Venom Other (See Comments)  . Diazepam     Nervous  . Fosamax  [Alendronate]   . Penicillins Swelling    and oral ulcers    Past Surgical History  Procedure Laterality Date  . Melanoma excision  05/2013    mid back Dr. Aubery Lapping  . Carpal tunnel release Right     and ganglion  . Partial hysterectomy       endometriosis  . Shoulder arthroscopy w/ acromial repair Right   . Abdominal hysterectomy      partial  . Ganglion cyst excision Right     hand x 2    Social History  Substance Use Topics  . Smoking status: Never Smoker   . Smokeless tobacco: None  . Alcohol Use: No    Medication list has been reviewed and updated.   Physical Exam  Constitutional: She is oriented to person, place, and time. She appears well-developed. No distress.  HENT:  Head: Normocephalic and atraumatic.  Eyes: Scleral icterus is present.  Cardiovascular: Normal heart sounds.  Bradycardia present.   Pulmonary/Chest: Effort normal and breath sounds normal. No accessory muscle usage. No respiratory distress.  Musculoskeletal:       Left ankle: She exhibits decreased range of motion and swelling. Tenderness.  Neurological: She is alert and oriented to person, place, and time.  Skin: Skin is warm and dry. No rash noted.  Psychiatric: She has a normal mood and affect. Her behavior is normal. Thought content normal.    BP 120/68 mmHg  Pulse 76  Ht 5\' 2"  (1.575 m)  Wt 117 lb 6.4 oz (53.252 kg)  BMI 21.47 kg/m2  Assessment and Plan: 1. Pre-operative clearance Needs to see cardiology - EKG 12-Lead - Ambulatory referral to Cardiology  2. Left bundle branch block (LBBB) new - Ambulatory referral to Cardiology  3. Bradycardia by electrocardiogram Patient instructed to cut metoprolol in half to 50 mg per day - Ambulatory referral to Cardiology  4. Ankle strain, left, initial encounter Recommend ACE wrap and elevation   Halina Maidens, MD [facility]  06/03/2015

## 2015-06-04 DIAGNOSIS — I447 Left bundle-branch block, unspecified: Secondary | ICD-10-CM | POA: Diagnosis not present

## 2015-06-04 DIAGNOSIS — I10 Essential (primary) hypertension: Secondary | ICD-10-CM | POA: Diagnosis not present

## 2015-06-04 DIAGNOSIS — R001 Bradycardia, unspecified: Secondary | ICD-10-CM | POA: Diagnosis not present

## 2015-06-05 ENCOUNTER — Telehealth: Payer: Self-pay

## 2015-06-05 NOTE — Telephone Encounter (Signed)
Informed by Pre-admit that patient will need medical clearance for Abnormal EKG due to a new Left Bundle Branch Block.   Seen by Dr. Army Melia, patient's PCP today and is going to require a cardiology referral.  Patient is on schedule for a Laparoscopic Cholecystectomy on 06/12/15 with Dr. Adonis Huguenin.  Will speak with Dr. Adonis Huguenin regarding this patient.

## 2015-06-05 NOTE — Telephone Encounter (Signed)
Spoke with Dr. Adonis Huguenin he advised me to cancel patient's surgery as her Bilirubin is now elevated to 7.0. He feels as though patient needs to be admitted by PCP and have an ERCP prior to going any further with surgery at this current time.

## 2015-06-06 DIAGNOSIS — R17 Unspecified jaundice: Secondary | ICD-10-CM | POA: Diagnosis not present

## 2015-06-06 DIAGNOSIS — Z01810 Encounter for preprocedural cardiovascular examination: Secondary | ICD-10-CM | POA: Diagnosis not present

## 2015-06-06 DIAGNOSIS — K632 Fistula of intestine: Secondary | ICD-10-CM | POA: Diagnosis not present

## 2015-06-06 DIAGNOSIS — D6959 Other secondary thrombocytopenia: Secondary | ICD-10-CM | POA: Diagnosis not present

## 2015-06-06 DIAGNOSIS — D539 Nutritional anemia, unspecified: Secondary | ICD-10-CM | POA: Diagnosis not present

## 2015-06-06 DIAGNOSIS — N183 Chronic kidney disease, stage 3 (moderate): Secondary | ICD-10-CM | POA: Diagnosis not present

## 2015-06-06 DIAGNOSIS — G4700 Insomnia, unspecified: Secondary | ICD-10-CM | POA: Diagnosis not present

## 2015-06-06 DIAGNOSIS — Z800 Family history of malignant neoplasm of digestive organs: Secondary | ICD-10-CM | POA: Diagnosis not present

## 2015-06-06 DIAGNOSIS — M1711 Unilateral primary osteoarthritis, right knee: Secondary | ICD-10-CM | POA: Diagnosis not present

## 2015-06-06 DIAGNOSIS — D696 Thrombocytopenia, unspecified: Secondary | ICD-10-CM | POA: Diagnosis not present

## 2015-06-06 DIAGNOSIS — T360X5A Adverse effect of penicillins, initial encounter: Secondary | ICD-10-CM | POA: Diagnosis not present

## 2015-06-06 DIAGNOSIS — R748 Abnormal levels of other serum enzymes: Secondary | ICD-10-CM | POA: Diagnosis not present

## 2015-06-06 DIAGNOSIS — Z01818 Encounter for other preprocedural examination: Secondary | ICD-10-CM | POA: Diagnosis not present

## 2015-06-06 DIAGNOSIS — R935 Abnormal findings on diagnostic imaging of other abdominal regions, including retroperitoneum: Secondary | ICD-10-CM | POA: Diagnosis not present

## 2015-06-06 DIAGNOSIS — Z8249 Family history of ischemic heart disease and other diseases of the circulatory system: Secondary | ICD-10-CM | POA: Diagnosis not present

## 2015-06-06 DIAGNOSIS — E43 Unspecified severe protein-calorie malnutrition: Secondary | ICD-10-CM | POA: Diagnosis not present

## 2015-06-06 DIAGNOSIS — E785 Hyperlipidemia, unspecified: Secondary | ICD-10-CM | POA: Diagnosis not present

## 2015-06-06 DIAGNOSIS — R7989 Other specified abnormal findings of blood chemistry: Secondary | ICD-10-CM | POA: Diagnosis not present

## 2015-06-06 DIAGNOSIS — I10 Essential (primary) hypertension: Secondary | ICD-10-CM | POA: Diagnosis not present

## 2015-06-06 DIAGNOSIS — R7881 Bacteremia: Secondary | ICD-10-CM | POA: Diagnosis not present

## 2015-06-06 DIAGNOSIS — Z4682 Encounter for fitting and adjustment of non-vascular catheter: Secondary | ICD-10-CM | POA: Diagnosis not present

## 2015-06-06 DIAGNOSIS — R000 Tachycardia, unspecified: Secondary | ICD-10-CM | POA: Diagnosis not present

## 2015-06-06 DIAGNOSIS — E876 Hypokalemia: Secondary | ICD-10-CM | POA: Diagnosis not present

## 2015-06-06 DIAGNOSIS — I129 Hypertensive chronic kidney disease with stage 1 through stage 4 chronic kidney disease, or unspecified chronic kidney disease: Secondary | ICD-10-CM | POA: Diagnosis not present

## 2015-06-06 DIAGNOSIS — I447 Left bundle-branch block, unspecified: Secondary | ICD-10-CM | POA: Diagnosis not present

## 2015-06-06 DIAGNOSIS — J9811 Atelectasis: Secondary | ICD-10-CM | POA: Diagnosis not present

## 2015-06-06 DIAGNOSIS — K8050 Calculus of bile duct without cholangitis or cholecystitis without obstruction: Secondary | ICD-10-CM | POA: Diagnosis not present

## 2015-06-06 DIAGNOSIS — R110 Nausea: Secondary | ICD-10-CM | POA: Diagnosis not present

## 2015-06-06 DIAGNOSIS — Z438 Encounter for attention to other artificial openings: Secondary | ICD-10-CM | POA: Diagnosis not present

## 2015-06-06 DIAGNOSIS — K8051 Calculus of bile duct without cholangitis or cholecystitis with obstruction: Secondary | ICD-10-CM | POA: Diagnosis not present

## 2015-06-06 DIAGNOSIS — K8020 Calculus of gallbladder without cholecystitis without obstruction: Secondary | ICD-10-CM | POA: Diagnosis not present

## 2015-06-06 DIAGNOSIS — M810 Age-related osteoporosis without current pathological fracture: Secondary | ICD-10-CM | POA: Diagnosis not present

## 2015-06-06 DIAGNOSIS — K219 Gastro-esophageal reflux disease without esophagitis: Secondary | ICD-10-CM | POA: Diagnosis not present

## 2015-06-06 DIAGNOSIS — I480 Paroxysmal atrial fibrillation: Secondary | ICD-10-CM | POA: Diagnosis not present

## 2015-06-06 DIAGNOSIS — J45909 Unspecified asthma, uncomplicated: Secondary | ICD-10-CM | POA: Diagnosis not present

## 2015-06-06 DIAGNOSIS — K822 Perforation of gallbladder: Secondary | ICD-10-CM | POA: Diagnosis not present

## 2015-06-06 DIAGNOSIS — K838 Other specified diseases of biliary tract: Secondary | ICD-10-CM | POA: Diagnosis not present

## 2015-06-06 DIAGNOSIS — Z833 Family history of diabetes mellitus: Secondary | ICD-10-CM | POA: Diagnosis not present

## 2015-06-06 DIAGNOSIS — R945 Abnormal results of liver function studies: Secondary | ICD-10-CM | POA: Diagnosis not present

## 2015-06-06 DIAGNOSIS — B966 Bacteroides fragilis [B. fragilis] as the cause of diseases classified elsewhere: Secondary | ICD-10-CM | POA: Diagnosis not present

## 2015-06-06 DIAGNOSIS — K831 Obstruction of bile duct: Secondary | ICD-10-CM | POA: Diagnosis not present

## 2015-06-06 DIAGNOSIS — Z6820 Body mass index (BMI) 20.0-20.9, adult: Secondary | ICD-10-CM | POA: Diagnosis not present

## 2015-06-06 NOTE — Telephone Encounter (Signed)
Patient's surgery has been canceled. I have contacted the OR and taken this off the calendar.

## 2015-06-06 NOTE — Telephone Encounter (Signed)
Spoke with Dr. Army Melia at this time. She does not have rights to admit to [facility] but would like our surgeons to speak with medicine.  Called Dr. Azalee Course. She agrees with Dr. Adonis Huguenin that patient needs to be admitted for elevation in Bilirubin level and the fact that patient is more symptomatic. However, she does not feel that we have the specialists needed at [facility] to have patient admitted here. She feels that she would be better served at one of the [facility].   Spoke with patient at this time. Explained the situation to her, she said that today has been the worst day so far with her symptoms. I explained that our surgeons are requesting that she either go to [facility] or [facility] hospital to be admitted today. She states that she is on her way to [facility] at this time.

## 2015-06-12 ENCOUNTER — Ambulatory Visit: Admission: RE | Admit: 2015-06-12 | Payer: Medicare Other | Source: Ambulatory Visit | Admitting: General Surgery

## 2015-06-12 ENCOUNTER — Encounter: Admission: RE | Payer: Self-pay | Source: Ambulatory Visit

## 2015-06-12 SURGERY — LAPAROSCOPIC CHOLECYSTECTOMY WITH INTRAOPERATIVE CHOLANGIOGRAM
Anesthesia: Choice

## 2015-06-27 ENCOUNTER — Other Ambulatory Visit: Payer: Self-pay | Admitting: Internal Medicine

## 2015-06-29 DIAGNOSIS — E785 Hyperlipidemia, unspecified: Secondary | ICD-10-CM | POA: Diagnosis not present

## 2015-06-29 DIAGNOSIS — N183 Chronic kidney disease, stage 3 (moderate): Secondary | ICD-10-CM | POA: Diagnosis not present

## 2015-06-29 DIAGNOSIS — M1710 Unilateral primary osteoarthritis, unspecified knee: Secondary | ICD-10-CM | POA: Diagnosis not present

## 2015-06-29 DIAGNOSIS — I129 Hypertensive chronic kidney disease with stage 1 through stage 4 chronic kidney disease, or unspecified chronic kidney disease: Secondary | ICD-10-CM | POA: Diagnosis not present

## 2015-06-29 DIAGNOSIS — K838 Other specified diseases of biliary tract: Secondary | ICD-10-CM | POA: Diagnosis not present

## 2015-07-01 DIAGNOSIS — K838 Other specified diseases of biliary tract: Secondary | ICD-10-CM | POA: Diagnosis not present

## 2015-07-01 DIAGNOSIS — E785 Hyperlipidemia, unspecified: Secondary | ICD-10-CM | POA: Diagnosis not present

## 2015-07-01 DIAGNOSIS — I129 Hypertensive chronic kidney disease with stage 1 through stage 4 chronic kidney disease, or unspecified chronic kidney disease: Secondary | ICD-10-CM | POA: Diagnosis not present

## 2015-07-01 DIAGNOSIS — N183 Chronic kidney disease, stage 3 (moderate): Secondary | ICD-10-CM | POA: Diagnosis not present

## 2015-07-01 DIAGNOSIS — M1710 Unilateral primary osteoarthritis, unspecified knee: Secondary | ICD-10-CM | POA: Diagnosis not present

## 2015-07-02 DIAGNOSIS — I4891 Unspecified atrial fibrillation: Secondary | ICD-10-CM | POA: Diagnosis not present

## 2015-07-02 DIAGNOSIS — R978 Other abnormal tumor markers: Secondary | ICD-10-CM | POA: Diagnosis not present

## 2015-07-02 DIAGNOSIS — K831 Obstruction of bile duct: Secondary | ICD-10-CM | POA: Diagnosis not present

## 2015-07-02 DIAGNOSIS — K838 Other specified diseases of biliary tract: Secondary | ICD-10-CM | POA: Diagnosis not present

## 2015-07-02 DIAGNOSIS — D696 Thrombocytopenia, unspecified: Secondary | ICD-10-CM | POA: Diagnosis not present

## 2015-07-03 DIAGNOSIS — E785 Hyperlipidemia, unspecified: Secondary | ICD-10-CM | POA: Diagnosis not present

## 2015-07-03 DIAGNOSIS — N183 Chronic kidney disease, stage 3 (moderate): Secondary | ICD-10-CM | POA: Diagnosis not present

## 2015-07-03 DIAGNOSIS — I129 Hypertensive chronic kidney disease with stage 1 through stage 4 chronic kidney disease, or unspecified chronic kidney disease: Secondary | ICD-10-CM | POA: Diagnosis not present

## 2015-07-03 DIAGNOSIS — K838 Other specified diseases of biliary tract: Secondary | ICD-10-CM | POA: Diagnosis not present

## 2015-07-03 DIAGNOSIS — M1710 Unilateral primary osteoarthritis, unspecified knee: Secondary | ICD-10-CM | POA: Diagnosis not present

## 2015-07-07 DIAGNOSIS — M1710 Unilateral primary osteoarthritis, unspecified knee: Secondary | ICD-10-CM | POA: Diagnosis not present

## 2015-07-07 DIAGNOSIS — K838 Other specified diseases of biliary tract: Secondary | ICD-10-CM | POA: Diagnosis not present

## 2015-07-07 DIAGNOSIS — N183 Chronic kidney disease, stage 3 (moderate): Secondary | ICD-10-CM | POA: Diagnosis not present

## 2015-07-07 DIAGNOSIS — I129 Hypertensive chronic kidney disease with stage 1 through stage 4 chronic kidney disease, or unspecified chronic kidney disease: Secondary | ICD-10-CM | POA: Diagnosis not present

## 2015-07-07 DIAGNOSIS — E785 Hyperlipidemia, unspecified: Secondary | ICD-10-CM | POA: Diagnosis not present

## 2015-07-09 DIAGNOSIS — I129 Hypertensive chronic kidney disease with stage 1 through stage 4 chronic kidney disease, or unspecified chronic kidney disease: Secondary | ICD-10-CM | POA: Diagnosis not present

## 2015-07-09 DIAGNOSIS — N183 Chronic kidney disease, stage 3 (moderate): Secondary | ICD-10-CM | POA: Diagnosis not present

## 2015-07-09 DIAGNOSIS — E785 Hyperlipidemia, unspecified: Secondary | ICD-10-CM | POA: Diagnosis not present

## 2015-07-09 DIAGNOSIS — M1710 Unilateral primary osteoarthritis, unspecified knee: Secondary | ICD-10-CM | POA: Diagnosis not present

## 2015-07-09 DIAGNOSIS — K838 Other specified diseases of biliary tract: Secondary | ICD-10-CM | POA: Diagnosis not present

## 2015-07-11 DIAGNOSIS — R2689 Other abnormalities of gait and mobility: Secondary | ICD-10-CM | POA: Diagnosis not present

## 2015-07-11 DIAGNOSIS — R54 Age-related physical debility: Secondary | ICD-10-CM | POA: Diagnosis not present

## 2015-07-14 DIAGNOSIS — M25561 Pain in right knee: Secondary | ICD-10-CM | POA: Diagnosis not present

## 2015-07-14 DIAGNOSIS — R410 Disorientation, unspecified: Secondary | ICD-10-CM | POA: Diagnosis not present

## 2015-07-14 DIAGNOSIS — M1990 Unspecified osteoarthritis, unspecified site: Secondary | ICD-10-CM | POA: Diagnosis not present

## 2015-07-14 DIAGNOSIS — K8065 Calculus of gallbladder and bile duct with chronic cholecystitis with obstruction: Secondary | ICD-10-CM | POA: Diagnosis not present

## 2015-07-14 DIAGNOSIS — K8045 Calculus of bile duct with chronic cholecystitis with obstruction: Secondary | ICD-10-CM | POA: Diagnosis not present

## 2015-07-14 DIAGNOSIS — T424X5A Adverse effect of benzodiazepines, initial encounter: Secondary | ICD-10-CM | POA: Diagnosis not present

## 2015-07-14 DIAGNOSIS — I129 Hypertensive chronic kidney disease with stage 1 through stage 4 chronic kidney disease, or unspecified chronic kidney disease: Secondary | ICD-10-CM | POA: Diagnosis not present

## 2015-07-14 DIAGNOSIS — I10 Essential (primary) hypertension: Secondary | ICD-10-CM | POA: Diagnosis not present

## 2015-07-14 DIAGNOSIS — E876 Hypokalemia: Secondary | ICD-10-CM | POA: Diagnosis not present

## 2015-07-14 DIAGNOSIS — I4891 Unspecified atrial fibrillation: Secondary | ICD-10-CM | POA: Diagnosis not present

## 2015-07-14 DIAGNOSIS — G8918 Other acute postprocedural pain: Secondary | ICD-10-CM | POA: Diagnosis not present

## 2015-07-14 DIAGNOSIS — K831 Obstruction of bile duct: Secondary | ICD-10-CM | POA: Diagnosis not present

## 2015-07-14 DIAGNOSIS — K823 Fistula of gallbladder: Secondary | ICD-10-CM | POA: Diagnosis not present

## 2015-07-14 DIAGNOSIS — Z9049 Acquired absence of other specified parts of digestive tract: Secondary | ICD-10-CM | POA: Diagnosis not present

## 2015-07-14 DIAGNOSIS — K838 Other specified diseases of biliary tract: Secondary | ICD-10-CM | POA: Diagnosis not present

## 2015-07-14 DIAGNOSIS — N390 Urinary tract infection, site not specified: Secondary | ICD-10-CM | POA: Diagnosis not present

## 2015-07-14 DIAGNOSIS — L988 Other specified disorders of the skin and subcutaneous tissue: Secondary | ICD-10-CM | POA: Diagnosis not present

## 2015-07-14 DIAGNOSIS — I447 Left bundle-branch block, unspecified: Secondary | ICD-10-CM | POA: Diagnosis not present

## 2015-07-14 DIAGNOSIS — R5381 Other malaise: Secondary | ICD-10-CM | POA: Diagnosis not present

## 2015-07-14 DIAGNOSIS — M810 Age-related osteoporosis without current pathological fracture: Secondary | ICD-10-CM | POA: Diagnosis not present

## 2015-07-14 DIAGNOSIS — N183 Chronic kidney disease, stage 3 (moderate): Secondary | ICD-10-CM | POA: Diagnosis not present

## 2015-07-14 DIAGNOSIS — K811 Chronic cholecystitis: Secondary | ICD-10-CM | POA: Diagnosis not present

## 2015-07-14 DIAGNOSIS — J90 Pleural effusion, not elsewhere classified: Secondary | ICD-10-CM | POA: Diagnosis not present

## 2015-07-23 DIAGNOSIS — N183 Chronic kidney disease, stage 3 (moderate): Secondary | ICD-10-CM | POA: Diagnosis not present

## 2015-07-23 DIAGNOSIS — I129 Hypertensive chronic kidney disease with stage 1 through stage 4 chronic kidney disease, or unspecified chronic kidney disease: Secondary | ICD-10-CM | POA: Diagnosis not present

## 2015-07-23 DIAGNOSIS — K838 Other specified diseases of biliary tract: Secondary | ICD-10-CM | POA: Diagnosis not present

## 2015-07-23 DIAGNOSIS — M1710 Unilateral primary osteoarthritis, unspecified knee: Secondary | ICD-10-CM | POA: Diagnosis not present

## 2015-07-23 DIAGNOSIS — E785 Hyperlipidemia, unspecified: Secondary | ICD-10-CM | POA: Diagnosis not present

## 2015-07-29 DIAGNOSIS — M1710 Unilateral primary osteoarthritis, unspecified knee: Secondary | ICD-10-CM | POA: Diagnosis not present

## 2015-07-29 DIAGNOSIS — N183 Chronic kidney disease, stage 3 (moderate): Secondary | ICD-10-CM | POA: Diagnosis not present

## 2015-07-29 DIAGNOSIS — Z9049 Acquired absence of other specified parts of digestive tract: Secondary | ICD-10-CM | POA: Diagnosis not present

## 2015-07-29 DIAGNOSIS — E785 Hyperlipidemia, unspecified: Secondary | ICD-10-CM | POA: Diagnosis not present

## 2015-07-29 DIAGNOSIS — Z48814 Encounter for surgical aftercare following surgery on the teeth or oral cavity: Secondary | ICD-10-CM | POA: Diagnosis not present

## 2015-07-29 DIAGNOSIS — K838 Other specified diseases of biliary tract: Secondary | ICD-10-CM | POA: Diagnosis not present

## 2015-07-29 DIAGNOSIS — I129 Hypertensive chronic kidney disease with stage 1 through stage 4 chronic kidney disease, or unspecified chronic kidney disease: Secondary | ICD-10-CM | POA: Diagnosis not present

## 2015-08-03 ENCOUNTER — Other Ambulatory Visit: Payer: Self-pay | Admitting: Internal Medicine

## 2015-08-05 DIAGNOSIS — E785 Hyperlipidemia, unspecified: Secondary | ICD-10-CM | POA: Diagnosis not present

## 2015-08-05 DIAGNOSIS — N183 Chronic kidney disease, stage 3 (moderate): Secondary | ICD-10-CM | POA: Diagnosis not present

## 2015-08-05 DIAGNOSIS — I129 Hypertensive chronic kidney disease with stage 1 through stage 4 chronic kidney disease, or unspecified chronic kidney disease: Secondary | ICD-10-CM | POA: Diagnosis not present

## 2015-08-05 DIAGNOSIS — Z48814 Encounter for surgical aftercare following surgery on the teeth or oral cavity: Secondary | ICD-10-CM | POA: Diagnosis not present

## 2015-08-05 DIAGNOSIS — Z9049 Acquired absence of other specified parts of digestive tract: Secondary | ICD-10-CM | POA: Diagnosis not present

## 2015-08-05 DIAGNOSIS — K838 Other specified diseases of biliary tract: Secondary | ICD-10-CM | POA: Diagnosis not present

## 2015-08-05 DIAGNOSIS — M1710 Unilateral primary osteoarthritis, unspecified knee: Secondary | ICD-10-CM | POA: Diagnosis not present

## 2015-08-06 DIAGNOSIS — K831 Obstruction of bile duct: Secondary | ICD-10-CM | POA: Diagnosis not present

## 2015-08-08 DIAGNOSIS — I129 Hypertensive chronic kidney disease with stage 1 through stage 4 chronic kidney disease, or unspecified chronic kidney disease: Secondary | ICD-10-CM | POA: Diagnosis not present

## 2015-08-08 DIAGNOSIS — K838 Other specified diseases of biliary tract: Secondary | ICD-10-CM | POA: Diagnosis not present

## 2015-08-08 DIAGNOSIS — M1710 Unilateral primary osteoarthritis, unspecified knee: Secondary | ICD-10-CM | POA: Diagnosis not present

## 2015-08-08 DIAGNOSIS — E785 Hyperlipidemia, unspecified: Secondary | ICD-10-CM | POA: Diagnosis not present

## 2015-08-08 DIAGNOSIS — Z48814 Encounter for surgical aftercare following surgery on the teeth or oral cavity: Secondary | ICD-10-CM | POA: Diagnosis not present

## 2015-08-08 DIAGNOSIS — Z9049 Acquired absence of other specified parts of digestive tract: Secondary | ICD-10-CM | POA: Diagnosis not present

## 2015-08-08 DIAGNOSIS — N183 Chronic kidney disease, stage 3 (moderate): Secondary | ICD-10-CM | POA: Diagnosis not present

## 2015-08-12 DIAGNOSIS — I9789 Other postprocedural complications and disorders of the circulatory system, not elsewhere classified: Secondary | ICD-10-CM | POA: Diagnosis not present

## 2015-08-12 DIAGNOSIS — I4891 Unspecified atrial fibrillation: Secondary | ICD-10-CM | POA: Diagnosis not present

## 2015-08-12 DIAGNOSIS — I481 Persistent atrial fibrillation: Secondary | ICD-10-CM | POA: Diagnosis not present

## 2015-08-15 DIAGNOSIS — I481 Persistent atrial fibrillation: Secondary | ICD-10-CM | POA: Diagnosis not present

## 2015-08-19 ENCOUNTER — Other Ambulatory Visit: Payer: Self-pay | Admitting: Internal Medicine

## 2015-08-25 ENCOUNTER — Other Ambulatory Visit: Payer: Self-pay | Admitting: Internal Medicine

## 2015-09-04 ENCOUNTER — Other Ambulatory Visit: Payer: Self-pay | Admitting: Internal Medicine

## 2015-09-12 DIAGNOSIS — I4891 Unspecified atrial fibrillation: Secondary | ICD-10-CM | POA: Diagnosis not present

## 2015-09-25 DIAGNOSIS — M1711 Unilateral primary osteoarthritis, right knee: Secondary | ICD-10-CM | POA: Diagnosis not present

## 2015-10-01 DIAGNOSIS — I4891 Unspecified atrial fibrillation: Secondary | ICD-10-CM | POA: Diagnosis not present

## 2015-10-06 DIAGNOSIS — I10 Essential (primary) hypertension: Secondary | ICD-10-CM | POA: Diagnosis not present

## 2015-10-06 DIAGNOSIS — I4891 Unspecified atrial fibrillation: Secondary | ICD-10-CM | POA: Diagnosis not present

## 2015-10-14 DIAGNOSIS — I4891 Unspecified atrial fibrillation: Secondary | ICD-10-CM | POA: Diagnosis not present

## 2015-11-10 ENCOUNTER — Ambulatory Visit: Payer: Self-pay | Admitting: Internal Medicine

## 2015-12-10 ENCOUNTER — Other Ambulatory Visit: Payer: Self-pay | Admitting: Internal Medicine

## 2015-12-21 ENCOUNTER — Other Ambulatory Visit: Payer: Self-pay | Admitting: Internal Medicine

## 2015-12-23 ENCOUNTER — Other Ambulatory Visit: Payer: Self-pay | Admitting: Internal Medicine

## 2015-12-25 ENCOUNTER — Other Ambulatory Visit: Payer: Self-pay | Admitting: Family Medicine

## 2015-12-25 DIAGNOSIS — Z1231 Encounter for screening mammogram for malignant neoplasm of breast: Secondary | ICD-10-CM

## 2016-01-04 ENCOUNTER — Other Ambulatory Visit: Payer: Self-pay | Admitting: Internal Medicine

## 2016-01-23 ENCOUNTER — Other Ambulatory Visit: Payer: Self-pay | Admitting: Unknown Physician Specialty

## 2016-01-23 DIAGNOSIS — M1711 Unilateral primary osteoarthritis, right knee: Secondary | ICD-10-CM

## 2016-02-03 ENCOUNTER — Other Ambulatory Visit: Payer: Self-pay | Admitting: Internal Medicine

## 2016-02-11 ENCOUNTER — Ambulatory Visit
Admission: RE | Admit: 2016-02-11 | Discharge: 2016-02-11 | Disposition: A | Discharge status: Home / Self Care | Payer: Medicare Other | Source: Ambulatory Visit | Attending: Unknown Physician Specialty | Admitting: Unknown Physician Specialty

## 2016-02-11 DIAGNOSIS — M1711 Unilateral primary osteoarthritis, right knee: Secondary | ICD-10-CM

## 2016-02-11 DIAGNOSIS — M23251 Derangement of posterior horn of lateral meniscus due to old tear or injury, right knee: Secondary | ICD-10-CM | POA: Diagnosis not present

## 2016-02-11 DIAGNOSIS — M2341 Loose body in knee, right knee: Secondary | ICD-10-CM | POA: Diagnosis not present

## 2016-02-11 DIAGNOSIS — M23241 Derangement of anterior horn of lateral meniscus due to old tear or injury, right knee: Secondary | ICD-10-CM | POA: Diagnosis not present

## 2016-02-12 ENCOUNTER — Other Ambulatory Visit: Payer: Self-pay | Admitting: Internal Medicine

## 2016-03-03 ENCOUNTER — Other Ambulatory Visit: Payer: Self-pay | Admitting: Internal Medicine

## 2016-03-04 ENCOUNTER — Other Ambulatory Visit: Payer: Self-pay | Admitting: Internal Medicine

## 2016-04-13 ENCOUNTER — Telehealth: Payer: Self-pay

## 2016-04-13 ENCOUNTER — Other Ambulatory Visit: Payer: Self-pay

## 2016-04-13 NOTE — Telephone Encounter (Signed)
Pt has been seeing Dr Kandice Robinsons and getting refills from you- FYI

## 2016-04-22 ENCOUNTER — Other Ambulatory Visit: Payer: Self-pay

## 2016-04-29 ENCOUNTER — Ambulatory Visit: Payer: Self-pay

## 2016-11-13 IMAGING — MR MR ABDOMEN WO/W CM MRCP
11 of 20 series · 23 of 48 positions shown · IV contrast (5ML MULTIHANCE)
Comparison: Abdominal ultrasound 05/05/2015

CLINICAL DATA: Right upper quadrant pain for 2 weeks. Weight loss.
Suspected gallstones. Abnormal ultrasound.

EXAM:
MRI ABDOMEN WITHOUT AND WITH CONTRAST (INCLUDING MRCP)
TECHNIQUE: Multiplanar multisequence MR imaging of the abdomen was performed
both before and after the administration of intravenous contrast.
Heavily T2-weighted images of the biliary and pancreatic ducts were
obtained, and three-dimensional MRCP images were rendered by post
processing.
CONTRAST:  5mL MULTIHANCE GADOBENATE DIMEGLUMINE 529 MG/ML IV SOLN

[Series 2: T2 fat-sat · axial · 7.0mm · 0.74mm/px · 1 of 23 slices shown]
[im 1/23]
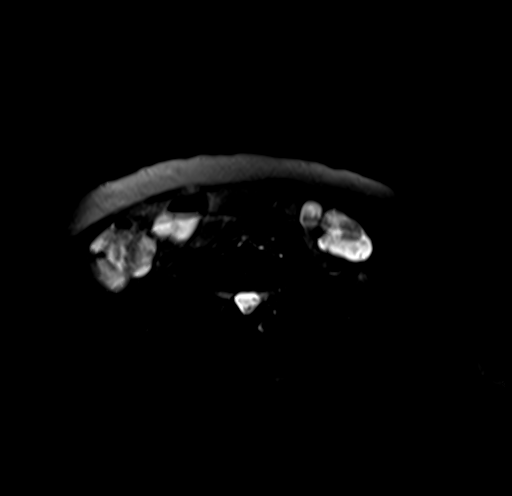

[Series 3: T2 · axial · 7.0mm · 0.74mm/px · 1 of 23 slices shown]
[im 1/23]
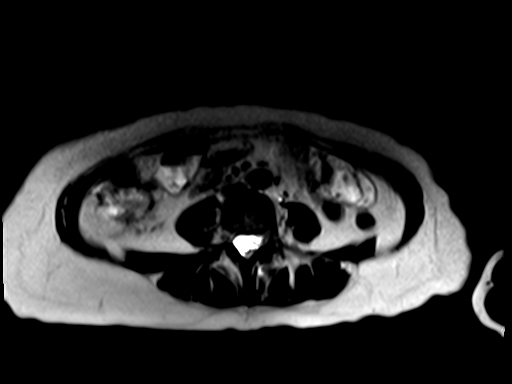

[Series 7: cor thins · coronal · 4.0mm · 0.89mm/px · 1 of 16 slices shown]
[im 1/16]
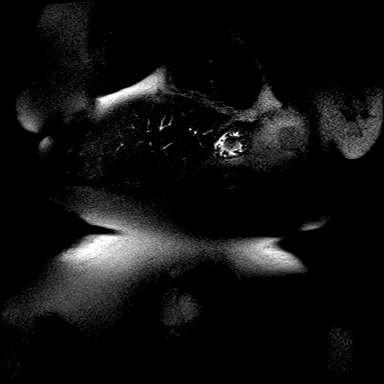

[Series 12: MRCP · coronal · 40.0mm · 0.91mm/px · 1 of 3 slices shown]
[im 1/3]
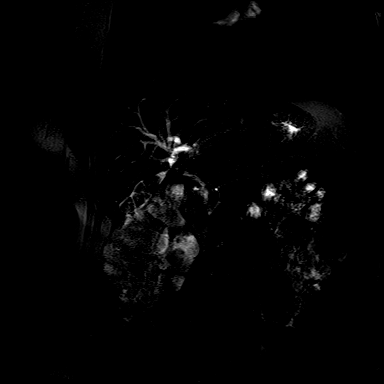

[Series 13: cor tru fisp · coronal · 4.0mm · 0.74mm/px · 1 of 44 slices shown (1 of 2)]
[im 1/44]
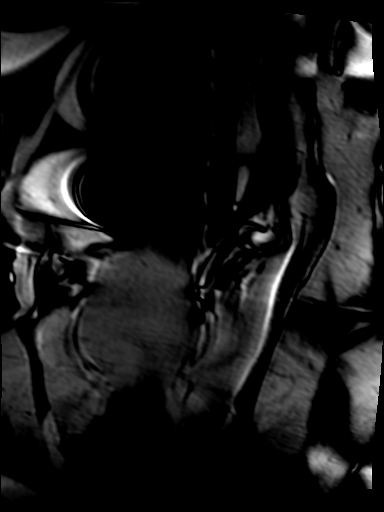

[Series 14: T1 dynamic fat-sat · axial · non-contrast · 2.5mm · 0.74mm/px · z∈[-78,+119]mm · 3 of 80 slices shown (1 of 2)]
[im 1/80]
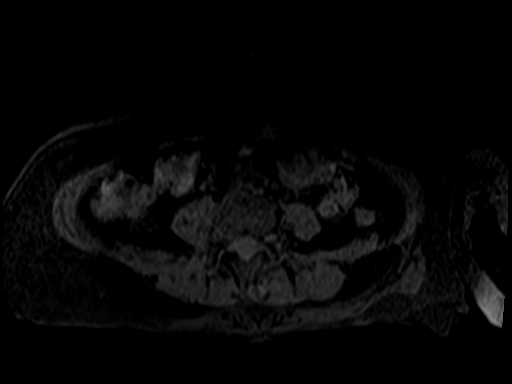
[im 40/80]
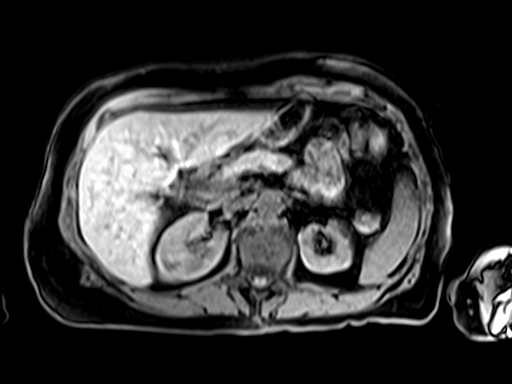
[im 80/80]
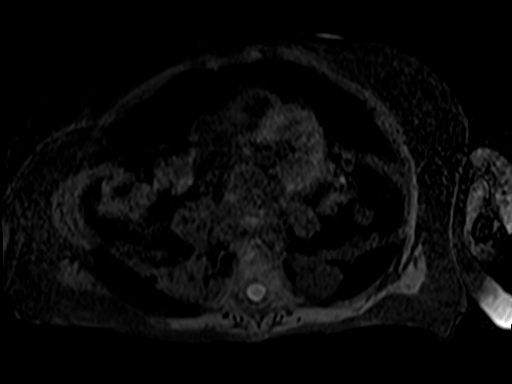

[Series 15: T1 dynamic fat-sat · axial · 2.5mm · 0.74mm/px · z∈[-78,+119]mm · 3 of 80 slices shown (2 of 2)]
[im 1/80]
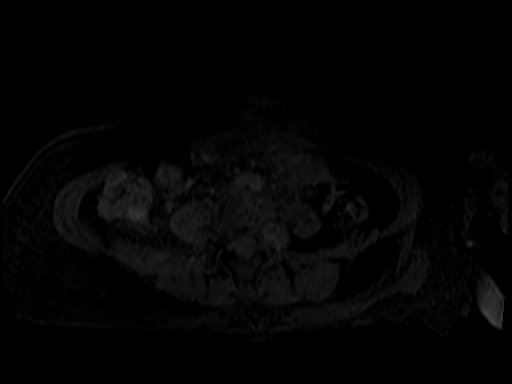
[im 40/80]
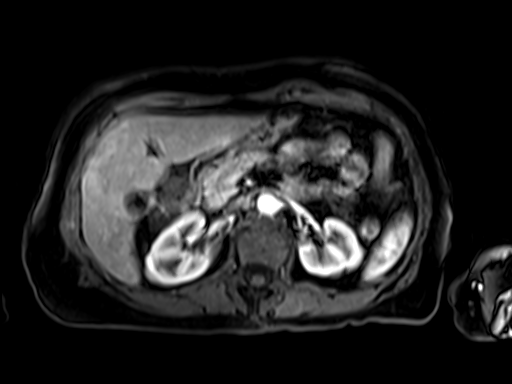
[im 80/80]
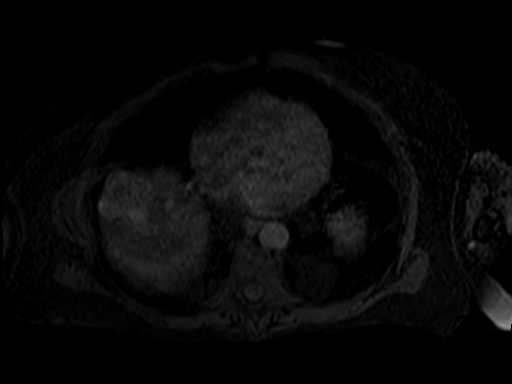

[Series 16: T1 dynamic fat-sat post-contrast · axial · 2.5mm · 0.74mm/px · z∈[-78,+119]mm · 4 of 80 slices shown (1 of 3)]
[im 1/80]
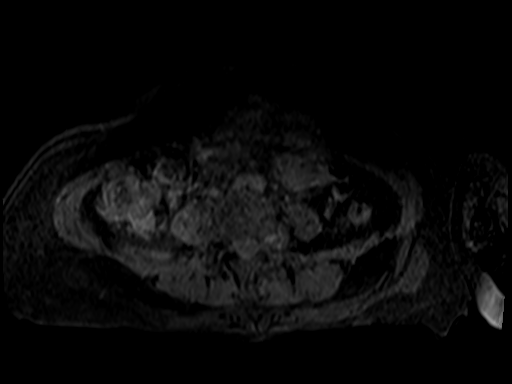
[im 27/80]
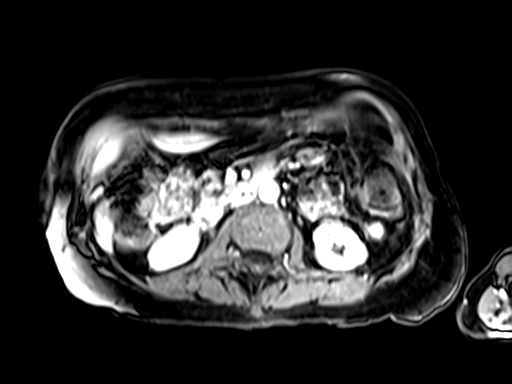
[im 53/80]
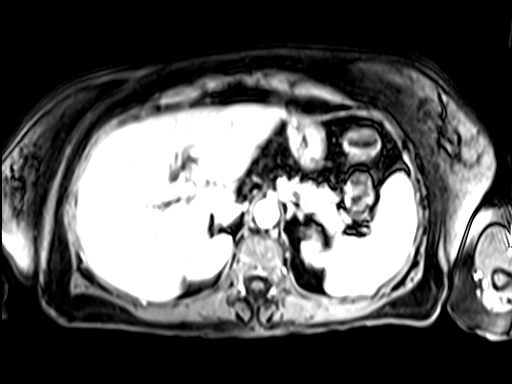
[im 80/80]
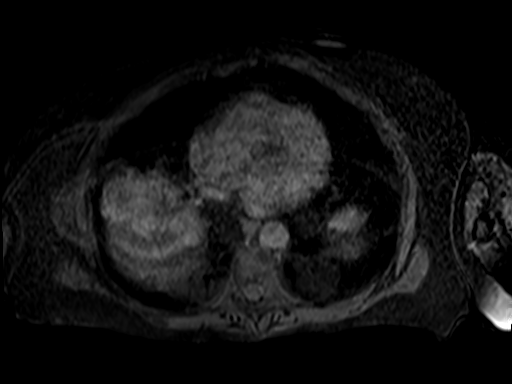

[Series 17: T1 dynamic fat-sat post-contrast · axial · 2.5mm · 0.74mm/px · z∈[-78,+119]mm · 4 of 80 slices shown (2 of 3)]
[im 1/80]
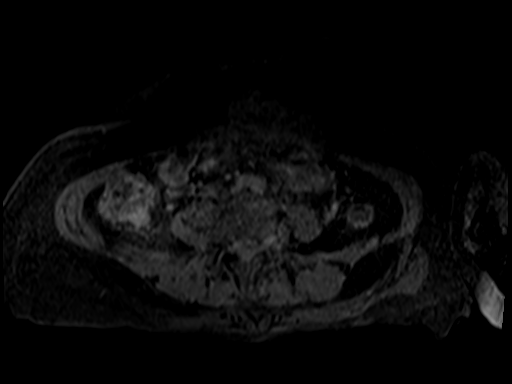
[im 27/80]
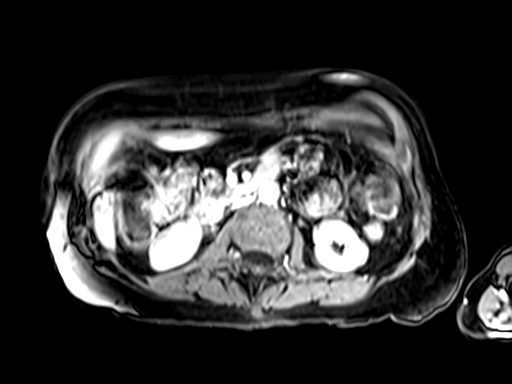
[im 53/80]
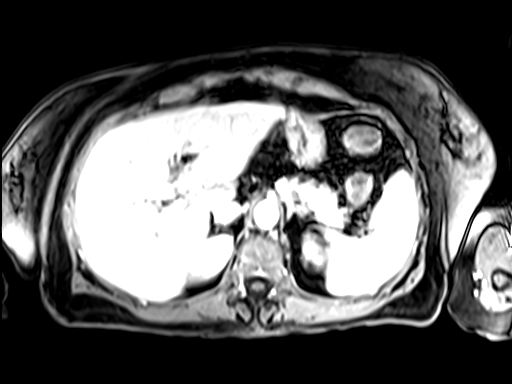
[im 80/80]
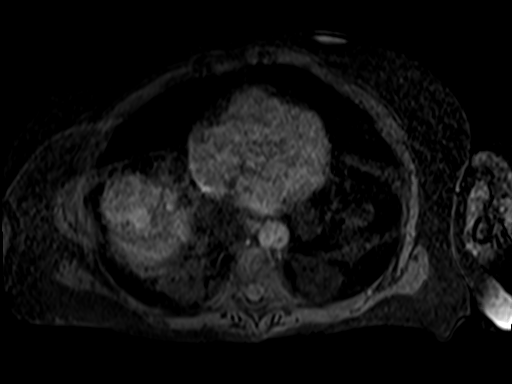

[Series 18: T1 dynamic fat-sat post-contrast · coronal · 2.5mm · 0.82mm/px · 3 of 72 slices shown (3 of 3)]
[im 1/72]
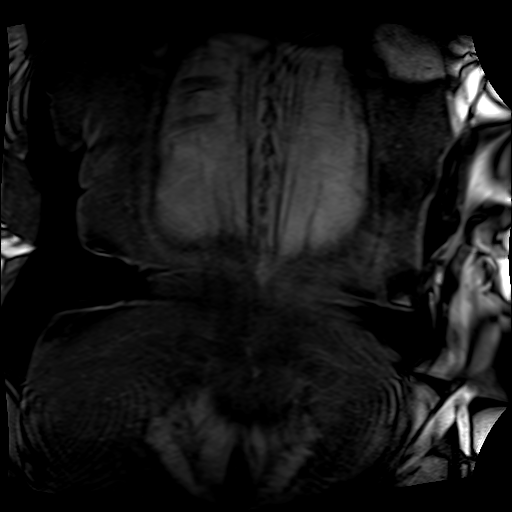
[im 36/72]
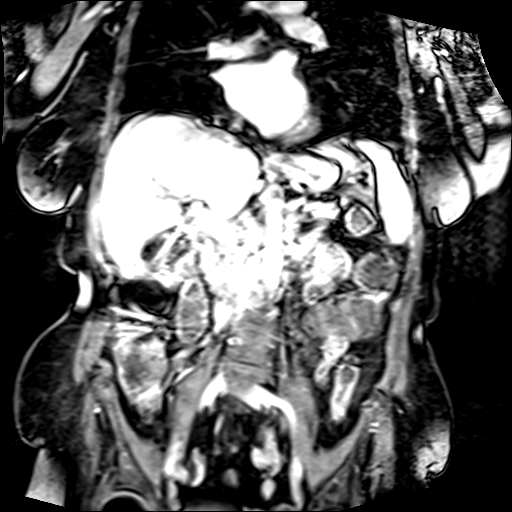
[im 72/72]
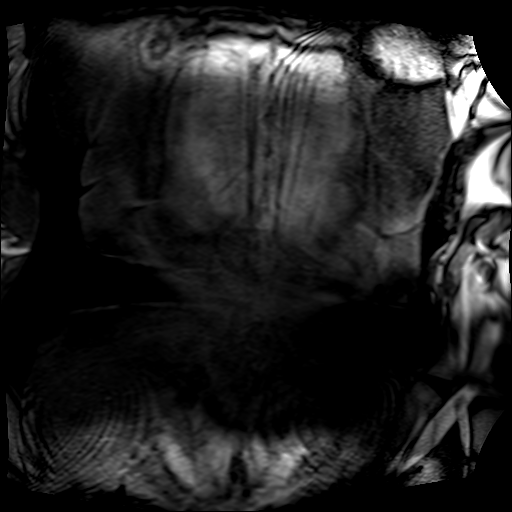

[Series 19: cor tru fisp · coronal · 4.0mm · 0.74mm/px · 1 of 8 slices shown (2 of 2)]
[im 1/8]
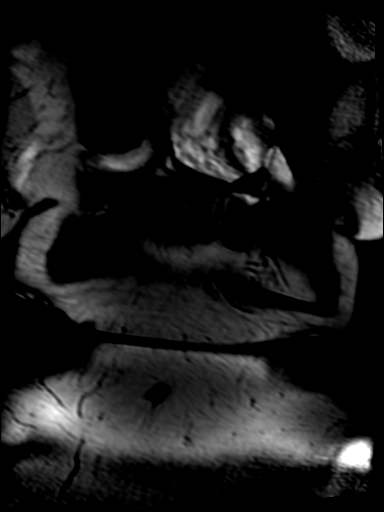

[23 of 48 positions shown; findings below may reference images not displayed]

FINDINGS: Exam is primarily mildly motion degraded. There are some sequences
that are moderately degraded, including early post-contrast images
and respiratory triggered T2 series 2.

Lower chest: Normal heart size without pericardial or pleural
effusion.

Hepatobiliary: No focal liver lesion. Moderate intrahepatic biliary
duct dilatation is identified, including a 4 mm right intrahepatic
duct on image 9/ series 3. The gallbladder is stone filled,
including with stones up the 14 mm on series 5. Partially
contracted, suggesting a component of chronic cholecystitis. The
extrahepatic common duct is normal, including 5 mm on image
14/series 5. Intrahepatic duct dilatation undergoes an apparent
caught off including on image 10/ series 5. Also image 3/ series 7.
No well-defined mass in this area. No convincing evidence of
choledocholithiasis.

Pancreas: Normal, without mass or ductal dilatation.

Spleen: Normal in size, without focal abnormality.

Adrenals/Urinary Tract: Normal adrenal glands. Mild renal cortical
thinning bilaterally. No renal mass. No hydronephrosis.

Stomach/Bowel: Normal stomach and abdominal bowel loops.

Vascular/Lymphatic: Normal caliber of the aorta and branch vessels.
No retroperitoneal or retrocrural adenopathy.

Other: No ascites.

Musculoskeletal: No acute osseous abnormality.
IMPRESSION: 1. Motion degraded exam, as detailed above.
2. Multiple gallstones. Suspicion of chronic cholecystitis.
Intrahepatic duct dilatation with a "cutoff" in the region of the
cystic duct insertion. Intrahepatic duct dilatation is favored to be
due to adjacent gallbladder inflammation (i.e. Mirizzi syndrome).
Otherwise occult extrahepatic cholangiocarcinoma or
choledocholithiasis felt less likely. Potential clinical strategies
include cholecystectomy with intraoperative evaluation of the
adjacent porta hepatis versus further imaging characterization with
pre and post contrast liver protocol abdominal CT.

## 2016-12-30 ENCOUNTER — Other Ambulatory Visit: Payer: Self-pay | Admitting: Internal Medicine

## 2016-12-31 ENCOUNTER — Other Ambulatory Visit: Payer: Self-pay | Admitting: Internal Medicine

## 2018-01-23 ENCOUNTER — Other Ambulatory Visit: Payer: Self-pay | Admitting: Nurse Practitioner

## 2018-01-23 ENCOUNTER — Other Ambulatory Visit: Payer: Self-pay

## 2018-01-23 ENCOUNTER — Emergency Department: Payer: Medicare Other

## 2018-01-23 ENCOUNTER — Emergency Department
Admission: EM | Admit: 2018-01-23 | Discharge: 2018-01-23 | Disposition: A | Discharge status: Home / Self Care | Payer: Medicare Other | Attending: Emergency Medicine | Admitting: Emergency Medicine

## 2018-01-23 ENCOUNTER — Encounter: Payer: Self-pay | Admitting: Emergency Medicine

## 2018-01-23 DIAGNOSIS — S42201A Unspecified fracture of upper end of right humerus, initial encounter for closed fracture: Secondary | ICD-10-CM | POA: Insufficient documentation

## 2018-01-23 DIAGNOSIS — Y929 Unspecified place or not applicable: Secondary | ICD-10-CM | POA: Diagnosis not present

## 2018-01-23 DIAGNOSIS — I129 Hypertensive chronic kidney disease with stage 1 through stage 4 chronic kidney disease, or unspecified chronic kidney disease: Secondary | ICD-10-CM | POA: Insufficient documentation

## 2018-01-23 DIAGNOSIS — S0181XA Laceration without foreign body of other part of head, initial encounter: Secondary | ICD-10-CM

## 2018-01-23 DIAGNOSIS — Y998 Other external cause status: Secondary | ICD-10-CM | POA: Diagnosis not present

## 2018-01-23 DIAGNOSIS — Z1231 Encounter for screening mammogram for malignant neoplasm of breast: Secondary | ICD-10-CM

## 2018-01-23 DIAGNOSIS — F329 Major depressive disorder, single episode, unspecified: Secondary | ICD-10-CM | POA: Diagnosis not present

## 2018-01-23 DIAGNOSIS — Y9389 Activity, other specified: Secondary | ICD-10-CM | POA: Insufficient documentation

## 2018-01-23 DIAGNOSIS — W01198A Fall on same level from slipping, tripping and stumbling with subsequent striking against other object, initial encounter: Secondary | ICD-10-CM | POA: Insufficient documentation

## 2018-01-23 DIAGNOSIS — F419 Anxiety disorder, unspecified: Secondary | ICD-10-CM | POA: Insufficient documentation

## 2018-01-23 DIAGNOSIS — Z85828 Personal history of other malignant neoplasm of skin: Secondary | ICD-10-CM | POA: Diagnosis not present

## 2018-01-23 DIAGNOSIS — W19XXXA Unspecified fall, initial encounter: Secondary | ICD-10-CM

## 2018-01-23 DIAGNOSIS — S4991XA Unspecified injury of right shoulder and upper arm, initial encounter: Secondary | ICD-10-CM | POA: Diagnosis present

## 2018-01-23 DIAGNOSIS — N183 Chronic kidney disease, stage 3 (moderate): Secondary | ICD-10-CM | POA: Insufficient documentation

## 2018-01-23 DIAGNOSIS — T1490XA Injury, unspecified, initial encounter: Secondary | ICD-10-CM

## 2018-01-23 MED ORDER — OXYCODONE-ACETAMINOPHEN 7.5-325 MG PO TABS: 1.0000 | ORAL_TABLET | ORAL | 0 refills | Status: AC | PRN

## 2018-01-23 MED ORDER — ACETAMINOPHEN 500 MG PO TABS
1000.0000 mg | ORAL_TABLET | Freq: Once | ORAL | Status: AC
  Administered 2018-01-23: 1000 mg via ORAL
  Filled 2018-01-23: qty 2

## 2018-01-23 MED ORDER — OXYCODONE HCL 5 MG PO TABS
5.0000 mg | ORAL_TABLET | Freq: Once | ORAL | Status: AC
  Administered 2018-01-23: 5 mg via ORAL
  Filled 2018-01-23: qty 1

## 2018-01-23 MED ORDER — LOPERAMIDE HCL 2 MG PO CAPS
4.0000 mg | ORAL_CAPSULE | ORAL | Status: DC | PRN
  Administered 2018-01-23: 4 mg via ORAL
  Filled 2018-01-23: qty 2

## 2018-01-23 NOTE — ED Notes (Signed)
Pt is being transported home by her neighbor, Marylee Floras.

## 2018-01-23 NOTE — ED Triage Notes (Signed)
Pt via ems from home with right shoulder pain after a fall; she tripped over a jewelry box. Denies loc or head injury. NAD noted.

## 2018-01-23 NOTE — ED Provider Notes (Signed)
[facility] Emergency Department Provider Note       Time seen: ----------------------------------------- 9:34 AM on 01/23/2018 -----------------------------------------   I have reviewed the triage vital signs and the nursing notes.  HISTORY   Chief Complaint No chief complaint on file.    HPI Shannon Le is a 72 y.o. female with a history of chronic kidney disease, depression, hypertension who presents to the ED for mechanical fall.  Patient states she was carrying a heavy piece of furniture and tripped and fell.  Her main complaint is right shoulder pain.  She states she hit the ground.  She has also was noted to have a cut below the left side of her lower lip.  She denies hitting her head or losing consciousness.  She denies any recent illness or other complaints.  Past Medical History:  Diagnosis Date  . 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 Chronic kidney disease    stage 3  . Depression   . Hypertension   . MGUS (monoclonal gammopathy of unknown significance)   . Monoclonal gammopathy of undetermined significance 2014   [facility]    Patient Active Problem List   Diagnosis Date Noted  . Left bundle branch block (LBBB) 06/03/2015  . Bradycardia by electrocardiogram 06/03/2015  . Gall bladder disease 05/12/2015  . Elevated liver function tests 09/21/2014  . Monoclonal gammopathy of undetermined significance 09/19/2014  . Acute thoracic back pain 07/23/2014  . Dyslipidemia 07/23/2014  . Essential (primary) hypertension 07/23/2014  . Anxiety, generalized 07/23/2014  . Decreased potassium in the blood 07/23/2014  . Depression, major, single episode, in partial remission ([place]) 07/23/2014  . OP (osteoporosis) 07/23/2014  . Insomnia, psychophysiological 07/23/2014  . Impaired renal function 07/23/2014    Past Surgical History:  Procedure Laterality Date  . ABDOMINAL HYSTERECTOMY     partial  . CARPAL TUNNEL  RELEASE Right    and ganglion  . GANGLION CYST EXCISION Right    hand x 2  . MELANOMA EXCISION  05/2013   mid back Dr. Aubery Lapping  . PARTIAL HYSTERECTOMY     endometriosis  . SHOULDER ARTHROSCOPY W/ ACROMIAL REPAIR Right     Allergies Codeine; Alendronate sodium; Atorvastatin; Bee venom; Diazepam; Fosamax  [alendronate]; Penicillins; and Zosyn [piperacillin sod-tazobactam so]  Social History Social History   Tobacco Use  . Smoking status: Never Smoker  Substance Use Topics  . Alcohol use: No    Alcohol/week: 0.0 standard drinks  . Drug use: No   Review of Systems Constitutional: Negative for fever. Eyes: Negative for vision changes ENT: Positive for bleeding from her lip Cardiovascular: Negative for chest pain. Respiratory: Negative for shortness of breath. Gastrointestinal: Negative for abdominal pain, vomiting and diarrhea. Musculoskeletal: Positive for right shoulder pain and swelling Skin: Positive for lip laceration Neurological: Negative for headaches, focal weakness or numbness.  All systems negative/normal/unremarkable except as stated in the HPI  ____________________________________________   PHYSICAL EXAM:  VITAL SIGNS: ED Triage Vitals  Enc Vitals Group     BP      Pulse      Resp      Temp      Temp src      SpO2      Weight      Height      Head Circumference      Peak Flow      Pain Score      Pain Loc  Pain Edu?      Excl. in [place]?    Constitutional: Alert and oriented. Well appearing and in no distress. Eyes: Conjunctivae are normal. Normal extraocular movements. ENT   Head: Normocephalic, mild bleeding from a chin laceration   Nose: No congestion/rhinnorhea.   Mouth/Throat: Mucous membranes are moist.   Neck: No stridor. Cardiovascular: Normal rate, regular rhythm. No murmurs, rubs, or gallops. Respiratory: Normal respiratory effort without tachypnea nor retractions. Breath sounds are clear and equal bilaterally. No  wheezes/rales/rhonchi. Gastrointestinal: Soft and nontender. Normal bowel sounds Musculoskeletal: Left and pain and swelling of the right upper arm is appreciated.  Limited range of motion of the right arm.  Right upper extremity appears neurovascularly intact.  She has normal pulses, can extend the wrist without difficulty. Neurologic:  Normal speech and language. No gross focal neurologic deficits are appreciated.  Skin: 1 cm laceration on her left anterior chin region Psychiatric: Mood and affect are normal. Speech and behavior are normal.  ____________________________________________  ED COURSE:  As part of my medical decision making, I reviewed the following data within the [place] History obtained from family if available, nursing notes, old chart and ekg, as well as notes from prior ED visits. Patient presented for fall with chin laceration and right shoulder pain, we will assess with imaging as indicated at this time. Clinical Course as of Jan 23 1210  Mon Jan 23, 2018  1134 Discussed with Dr. Marry Guan from orthopedics, we will continue shoulder immobilizer and outpatient follow-up.  It is doubtful that she will require surgery.   [JW]    Clinical Course User Index [JW] Earleen Newport, MD   ..Laceration Repair Date/Time: 01/23/2018 12:12 PM Performed by: Earleen Newport, MD Authorized by: Earleen Newport, MD   Consent:    Consent obtained:  Verbal   Consent given by:  Patient Anesthesia (see MAR for exact dosages):    Anesthesia method:  None Laceration details:    Location:  Face   Face location:  Chin   Length (cm):  1   Depth (mm):  2 Repair type:    Repair type:  Simple Treatment:    Amount of cleaning:  Standard   Irrigation solution:  Tap water Skin repair:    Repair method:  Tissue adhesive Approximation:    Approximation:  Close Post-procedure details:    Dressing:  Open (no dressing)   Patient tolerance of procedure:   Tolerated well, no immediate complications   ____________________________________________   RADIOLOGY Images were viewed by me Right humerus x-ray IMPRESSION: Acute, comminuted, displaced, and angulated fracture of the proximal right humerus extending from the humeral neck to the midshaft.  ____________________________________________  DIFFERENTIAL DIAGNOSIS   Fracture, contusion, dislocation, laceration  FINAL ASSESSMENT AND PLAN  Right humerus fracture, chin laceration   Plan: The patient had presented for a fall. Patient's imaging did reveal a significant comminuted proximal right humerus fracture.  We have placed in a shoulder immobilizer, chin laceration was repaired as dictated above.  I did discuss the case with orthopedics who will see her in follow-up.  Dr. Marry Guan did not feel like this fracture would require surgery, but rather immobilization.  Pain is improved at this time.   Laurence Aly, MD   Note: This note was generated in part or whole with voice recognition software. Voice recognition is usually quite accurate but there are transcription errors that can and very often do occur. I apologize for any typographical errors that  were not detected and corrected.     Earleen Newport, MD 01/23/18 1215

## 2018-01-23 NOTE — ED Notes (Signed)
Pt states that if surgery is required, she wants to go to [facility].

## 2018-01-23 NOTE — ED Notes (Signed)
Pt presents post fall; has difficulty moving her right arm; c/o right shoulder pain. Pt states she tripped over a jewelry box. Denies loc. Pt also has small lac to her chin, which has been glued. NAD noted.

## 2018-01-31 ENCOUNTER — Ambulatory Visit: Payer: Self-pay

## 2019-06-12 ENCOUNTER — Other Ambulatory Visit: Payer: Self-pay | Admitting: Nurse Practitioner

## 2019-06-12 DIAGNOSIS — M810 Age-related osteoporosis without current pathological fracture: Secondary | ICD-10-CM

## 2019-07-16 ENCOUNTER — Ambulatory Visit
Admission: RE | Admit: 2019-07-16 | Discharge: 2019-07-16 | Disposition: A | Discharge status: Home / Self Care | Payer: Medicare Other | Source: Ambulatory Visit | Attending: Nurse Practitioner | Admitting: Nurse Practitioner

## 2019-07-16 ENCOUNTER — Other Ambulatory Visit: Payer: Self-pay

## 2019-07-16 DIAGNOSIS — M810 Age-related osteoporosis without current pathological fracture: Secondary | ICD-10-CM | POA: Diagnosis present

## 2020-02-07 ENCOUNTER — Other Ambulatory Visit: Payer: Self-pay | Admitting: Nurse Practitioner

## 2020-02-07 DIAGNOSIS — Z1231 Encounter for screening mammogram for malignant neoplasm of breast: Secondary | ICD-10-CM

## 2020-03-18 ENCOUNTER — Ambulatory Visit: Payer: Medicare Other

## 2020-04-03 ENCOUNTER — Ambulatory Visit: Payer: Medicare Other

## 2020-04-16 ENCOUNTER — Inpatient Hospital Stay: Admission: RE | Admit: 2020-04-16 | Payer: Medicare Other | Source: Ambulatory Visit

## 2020-05-12 ENCOUNTER — Encounter: Payer: Self-pay | Admitting: Ophthalmology

## 2020-05-12 ENCOUNTER — Other Ambulatory Visit: Payer: Self-pay

## 2020-05-14 NOTE — Discharge Instructions (Signed)

## 2020-05-15 ENCOUNTER — Other Ambulatory Visit: Payer: Self-pay

## 2020-05-15 ENCOUNTER — Other Ambulatory Visit
Admission: RE | Admit: 2020-05-15 | Discharge: 2020-05-15 | Disposition: A | Discharge status: Home / Self Care | Payer: Medicare Other | Source: Ambulatory Visit | Attending: Ophthalmology | Admitting: Ophthalmology

## 2020-05-15 DIAGNOSIS — Z20822 Contact with and (suspected) exposure to covid-19: Secondary | ICD-10-CM | POA: Insufficient documentation

## 2020-05-15 DIAGNOSIS — Z01812 Encounter for preprocedural laboratory examination: Secondary | ICD-10-CM | POA: Insufficient documentation

## 2020-05-15 LAB — SARS CORONAVIRUS 2 (TAT 6-24 HRS): SARS Coronavirus 2: NEGATIVE

## 2020-05-19 ENCOUNTER — Encounter: Payer: Self-pay | Admitting: Ophthalmology

## 2020-05-19 ENCOUNTER — Other Ambulatory Visit: Payer: Self-pay

## 2020-05-19 ENCOUNTER — Ambulatory Visit
Admission: RE | Admit: 2020-05-19 | Discharge: 2020-05-19 | Disposition: A | Discharge status: Home / Self Care | Payer: Medicare Other | Attending: Ophthalmology | Admitting: Ophthalmology

## 2020-05-19 ENCOUNTER — Ambulatory Visit: Payer: Medicare Other | Admitting: Anesthesiology

## 2020-05-19 ENCOUNTER — Encounter
Admission: RE | Disposition: A | Discharge status: Home / Self Care | Payer: Self-pay | Source: Home / Self Care | Attending: Ophthalmology

## 2020-05-19 DIAGNOSIS — H2511 Age-related nuclear cataract, right eye: Secondary | ICD-10-CM | POA: Diagnosis not present

## 2020-05-19 DIAGNOSIS — Z79899 Other long term (current) drug therapy: Secondary | ICD-10-CM | POA: Diagnosis not present

## 2020-05-19 DIAGNOSIS — Z85820 Personal history of malignant melanoma of skin: Secondary | ICD-10-CM | POA: Diagnosis not present

## 2020-05-19 DIAGNOSIS — Z7901 Long term (current) use of anticoagulants: Secondary | ICD-10-CM | POA: Insufficient documentation

## 2020-05-19 HISTORY — DX: Presence of dental prosthetic device (complete) (partial): Z97.2

## 2020-05-19 HISTORY — PX: CATARACT EXTRACTION W/PHACO: SHX586

## 2020-05-19 SURGERY — PHACOEMULSIFICATION, CATARACT, WITH IOL INSERTION
Anesthesia: Monitor Anesthesia Care | Site: Eye | Laterality: Right

## 2020-05-19 MED ORDER — ACETAMINOPHEN 160 MG/5ML PO SOLN: 325.0000 mg | Freq: Once | ORAL | Status: DC

## 2020-05-19 MED ORDER — LACTATED RINGERS IV SOLN: INTRAVENOUS | Status: DC

## 2020-05-19 MED ORDER — SODIUM HYALURONATE 10 MG/ML IO SOLN
INTRAOCULAR | Status: DC | PRN
  Administered 2020-05-19: 0.55 mL via INTRAOCULAR

## 2020-05-19 MED ORDER — ARMC OPHTHALMIC DILATING DROPS
1.0000 "application " | OPHTHALMIC | Status: DC | PRN
  Administered 2020-05-19 (×3): 1 via OPHTHALMIC

## 2020-05-19 MED ORDER — SODIUM HYALURONATE 23 MG/ML IO SOLN
INTRAOCULAR | Status: DC | PRN
  Administered 2020-05-19: 0.6 mL via INTRAOCULAR

## 2020-05-19 MED ORDER — EPINEPHRINE PF 1 MG/ML IJ SOLN
INTRAOCULAR | Status: DC | PRN
  Administered 2020-05-19: 60 mL via OPHTHALMIC

## 2020-05-19 MED ORDER — MIDAZOLAM HCL 2 MG/2ML IJ SOLN
INTRAMUSCULAR | Status: DC | PRN
  Administered 2020-05-19: 1 mg via INTRAVENOUS

## 2020-05-19 MED ORDER — LIDOCAINE HCL (PF) 2 % IJ SOLN
INTRAOCULAR | Status: DC | PRN
  Administered 2020-05-19: 1 mL via INTRAOCULAR

## 2020-05-19 MED ORDER — ACETAMINOPHEN 325 MG PO TABS: 325.0000 mg | ORAL_TABLET | Freq: Once | ORAL | Status: DC

## 2020-05-19 MED ORDER — MOXIFLOXACIN HCL 0.5 % OP SOLN
OPHTHALMIC | Status: DC | PRN
  Administered 2020-05-19: 0.2 mL via OPHTHALMIC

## 2020-05-19 MED ORDER — TETRACAINE HCL 0.5 % OP SOLN
1.0000 [drp] | OPHTHALMIC | Status: DC | PRN
  Administered 2020-05-19 (×3): 1 [drp] via OPHTHALMIC

## 2020-05-19 MED ORDER — FENTANYL CITRATE (PF) 100 MCG/2ML IJ SOLN
INTRAMUSCULAR | Status: DC | PRN
  Administered 2020-05-19: 50 ug via INTRAVENOUS

## 2020-05-19 SURGICAL SUPPLY — 19 items

## 2020-05-19 NOTE — Transfer of Care (Signed)
Immediate Anesthesia Transfer of Care Note  Patient: Shannon Le  Procedure(s) Performed: CATARACT EXTRACTION PHACO AND INTRAOCULAR LENS PLACEMENT (IOC) RIGHT (Right Eye)  Patient Location: PACU  Anesthesia Type: MAC  Level of Consciousness: awake, alert  and patient cooperative  Airway and Oxygen Therapy: Patient Spontanous Breathing and Patient connected to supplemental oxygen  Post-op Assessment: Post-op Vital signs reviewed, Patient's Cardiovascular Status Stable, Respiratory Function Stable, Patent Airway and No signs of Nausea or vomiting  Post-op Vital Signs: Reviewed and stable  Complications: No complications documented.

## 2020-05-19 NOTE — Anesthesia Postprocedure Evaluation (Signed)
Anesthesia Post Note  Patient: Shannon Le  Procedure(s) Performed: CATARACT EXTRACTION PHACO AND INTRAOCULAR LENS PLACEMENT (IOC) RIGHT (Right Eye)     Patient location during evaluation: PACU Anesthesia Type: MAC Level of consciousness: awake and alert and oriented Pain management: satisfactory to patient Vital Signs Assessment: post-procedure vital signs reviewed and stable Respiratory status: spontaneous breathing, nonlabored ventilation and respiratory function stable Cardiovascular status: blood pressure returned to baseline and stable Postop Assessment: Adequate PO intake and No signs of nausea or vomiting Anesthetic complications: no   No complications documented.  Raliegh Ip

## 2020-05-19 NOTE — Op Note (Signed)
OPERATIVE NOTE  IRIDIAN READER [id number] 05/19/2020   PREOPERATIVE DIAGNOSIS:  Nuclear sclerotic cataract right eye.  H25.11   POSTOPERATIVE DIAGNOSIS:    Nuclear sclerotic cataract right eye.     PROCEDURE:  Phacoemusification with posterior chamber intraocular lens placement of the right eye   LENS:   Implant Name Type Inv. Item Serial No. Manufacturer Lot No. LRB No. Used Action  LENS IOL TECNIS EYHANCE 13.0 - [id number] Intraocular Lens LENS IOL TECNIS EYHANCE 13.0 [id number] JOHNSON   Right 1 Implanted       Procedure(s) with comments: CATARACT EXTRACTION PHACO AND INTRAOCULAR LENS PLACEMENT (IOC) RIGHT (Right) - 2.61 0:25.0  DIB00 +13.0   ULTRASOUND TIME: 0 minutes 25 seconds.  CDE 2.61   SURGEON:  Benay Pillow, MD, MPH  ANESTHESIOLOGIST: Anesthesiologist: Ronelle Nigh, MD CRNA: Mayme Genta, CRNA; Cameron Ali, CRNA   ANESTHESIA:  Topical with tetracaine drops augmented with 1% preservative-free intracameral lidocaine.  ESTIMATED BLOOD LOSS: less than 1 mL.   COMPLICATIONS:  None.   DESCRIPTION OF PROCEDURE:  The patient was identified in the holding room and transported to the operating room and placed in the supine position under the operating microscope.  The right eye was identified as the operative eye and it was prepped and draped in the usual sterile ophthalmic fashion.   A 1.0 millimeter clear-corneal paracentesis was made at the 10:30 position. 0.5 ml of preservative-free 1% lidocaine with epinephrine was injected into the anterior chamber.  The anterior chamber was filled with Healon 5 viscoelastic.  A 2.4 millimeter keratome was used to make a near-clear corneal incision at the 8:00 position.  A curvilinear capsulorrhexis was made with a cystotome and capsulorrhexis forceps.  Balanced salt solution was used to hydrodissect and hydrodelineate the nucleus.   Phacoemulsification was then used in stop and chop fashion to remove the lens nucleus and  epinucleus.  The remaining cortex was then removed using the irrigation and aspiration handpiece. Healon was then placed into the capsular bag to distend it for lens placement.  A lens was then injected into the capsular bag.  The remaining viscoelastic was aspirated.   Wounds were hydrated with balanced salt solution.  The anterior chamber was inflated to a physiologic pressure with balanced salt solution.   Intracameral vigamox 0.1 mL undiluted was injected into the eye and a drop placed onto the ocular surface.  No wound leaks were noted.  The patient was taken to the recovery room in stable condition without complications of anesthesia or surgery  Benay Pillow 05/19/2020, 8:54 AM

## 2020-05-19 NOTE — Anesthesia Preprocedure Evaluation (Signed)
Anesthesia Evaluation  Patient identified by MRN, date of birth, ID band Patient awake    Reviewed: Allergy & Precautions, H&P , NPO status , Patient's Chart, lab work & pertinent test results  Airway Mallampati: II  TM Distance: >3 FB Neck ROM: full    Dental no notable dental hx. (+) Missing, Partial Lower   Pulmonary    Pulmonary exam normal breath sounds clear to auscultation       Cardiovascular hypertension, Normal cardiovascular exam Rhythm:regular Rate:Normal     Neuro/Psych PSYCHIATRIC DISORDERS    GI/Hepatic   Endo/Other    Renal/GU Renal disease     Musculoskeletal   Abdominal   Peds  Hematology   Anesthesia Other Findings   Reproductive/Obstetrics                             Anesthesia Physical Anesthesia Plan  ASA: II  Anesthesia Plan: MAC   Post-op Pain Management:    Induction:   PONV Risk Score and Plan: 2 and Treatment may vary due to age or medical condition, Midazolam and TIVA  Airway Management Planned:   Additional Equipment:   Intra-op Plan:   Post-operative Plan:   Informed Consent: I have reviewed the patients History and Physical, chart, labs and discussed the procedure including the risks, benefits and alternatives for the proposed anesthesia with the patient or authorized representative who has indicated his/her understanding and acceptance.     Dental Advisory Given  Plan Discussed with: CRNA  Anesthesia Plan Comments:         Anesthesia Quick Evaluation

## 2020-05-19 NOTE — Anesthesia Procedure Notes (Signed)
Procedure Name: MAC Date/Time: 05/19/2020 8:35 AM Performed by: Cameron Ali, CRNA Pre-anesthesia Checklist: Patient identified, Emergency Drugs available, Suction available, Timeout performed and Patient being monitored Patient Re-evaluated:Patient Re-evaluated prior to induction Oxygen Delivery Method: Nasal cannula Placement Confirmation: positive ETCO2

## 2020-05-19 NOTE — H&P (Signed)
[facility]   Primary Care Physician:  [place] Ophthalmologist: Dr. Benay Pillow  Pre-Procedure History & Physical: HPI:  Shannon Le is a 75 y.o. female here for cataract surgery.   Past Medical History:  Diagnosis Date  . 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 Chronic kidney disease    stage 3  . Depression   . Hypertension   . MGUS (monoclonal gammopathy of unknown significance)   . Monoclonal gammopathy of undetermined significance 2014   [place]  . Wears dentures    partial lower    Past Surgical History:  Procedure Laterality Date  . ABDOMINAL HYSTERECTOMY     partial  . CARPAL TUNNEL RELEASE Right    and ganglion  . GANGLION CYST EXCISION Right    hand x 2  . MELANOMA EXCISION  05/2013   mid back Dr. Aubery Lapping  . PARTIAL HYSTERECTOMY     endometriosis  . SHOULDER ARTHROSCOPY W/ ACROMIAL REPAIR Right     Prior to Admission medications   Medication Sig Start Date End Date Taking? Authorizing Provider  acetaminophen (TYLENOL) 500 MG tablet Take 1,000 mg by mouth every 6 (six) hours as needed.   Yes [provider]  ALPRAZolam Duanne Moron) 0.5 MG tablet TAKE 1 TABLET BY MOUTH EVERY NIGHT AT BEDTIME AS NEEDED 05/22/15  Yes Glean Hess, MD  benazepril (LOTENSIN) 20 MG tablet Take 20 mg by mouth daily.   Yes [provider]  Biotin 5000 MCG CAPS Take 1 capsule by mouth at bedtime.    Yes [provider]  buPROPion (WELLBUTRIN SR) 150 MG 12 hr tablet TAKE 1 TABLET BY MOUTH TWICE A DAY Patient taking differently: daily. 08/19/15  Yes Glean Hess, MD  cetirizine (ZYRTEC) 10 MG tablet Take 10 mg by mouth daily as needed for allergies.   Yes [provider]  citalopram (CELEXA) 10 MG tablet TAKE 1 TABLET BY MOUTH EVERY DAY 06/27/15  Yes Glean Hess, MD  diclofenac Sodium (VOLTAREN) 1 % GEL Apply topically as needed.   Yes [provider]  diltiazem (TIAZAC) 360 MG  24 hr capsule Take 360 mg by mouth daily.   Yes [provider]  ELIQUIS 5 MG TABS tablet TAKE 1 TABLET BY MOUTH EVERY 12 HOURS 08/26/15  Yes Glean Hess, MD  furosemide (LASIX) 20 MG tablet Take 20 mg by mouth.   Yes [provider]  GUAIFENESIN ER PO Take by mouth 2 (two) times daily as needed.   Yes [provider]  magnesium oxide (MAG-OX) 400 MG tablet Take 400 mg by mouth 2 (two) times daily.   Yes [provider]  metoprolol succinate (TOPROL-XL) 100 MG 24 hr tablet TAKE 1 TABLET BY MOUTH EVERY DAY Patient taking differently: Take 25 mg by mouth daily. 05/22/15  Yes Glean Hess, MD  Multiple Vitamin (MULTIVITAMIN) tablet Take 1 tablet by mouth daily.   Yes [provider]  omeprazole (PRILOSEC) 40 MG capsule Take 40 mg by mouth daily.   Yes [provider]  vitamin B-12 (CYANOCOBALAMIN) 500 MCG tablet Take 500 mcg by mouth daily.   Yes [provider]  diphenhydrAMINE (BENADRYL) 25 mg capsule Take 25 mg by mouth every 6 (six) hours as needed. Patient not taking: Reported on 05/12/2020    [provider]    Allergies as of 04/03/2020 - Review Complete 01/23/2018  Allergen Reaction Noted  . Codeine Shortness Of  Breath 07/23/2014  . Alendronate sodium Other (See Comments) 09/19/2014  . Atorvastatin Diarrhea and Nausea And Vomiting 07/23/2014  . Bee venom Other (See Comments) 09/19/2014  . Diazepam  09/19/2014  . Fosamax  [alendronate]  07/23/2014  . Penicillins Swelling 07/23/2014  . Zosyn [piperacillin sod-tazobactam so] Other (See Comments) 07/14/2015    Family History  Problem Relation Age of Onset  . Mental illness Other   . Diabetes Mother   . Diabetes Brother   . Colon cancer Brother   . Stroke Brother     Social History   Socioeconomic History  . Marital status: Divorced    Spouse name: Not on file  . Number of children: Not on file  . Years of education: Not on file  . Highest  education level: Not on file  Occupational History  . Not on file  Tobacco Use  . Smoking status: Never Smoker  . Smokeless tobacco: Never Used  Vaping Use  . Vaping Use: Never used  Substance and Sexual Activity  . Alcohol use: No    Alcohol/week: 0.0 standard drinks  . Drug use: No  . Sexual activity: Not on file  Other Topics Concern  . Not on file  Social History Narrative  . Not on file   Social Determinants of Health   Financial Resource Strain: Not on file  Food Insecurity: Not on file  Transportation Needs: Not on file  Physical Activity: Not on file  Stress: Not on file  Social Connections: Not on file  Intimate Partner Violence: Not on file    Review of Systems: See HPI, otherwise negative ROS  Physical Exam: BP (!) 126/56   Pulse 95   Temp (!) 97 F (36.1 C) (Temporal)   Resp 16   Ht 5\' 1"  (1.549 m)   Wt 79.8 kg   SpO2 95%   BMI 33.25 kg/m  General:   Alert,  pleasant and cooperative in NAD Head:  Normocephalic and atraumatic. Respiratory:  Normal work of breathing.  Impression/Plan: Shannon Le is here for cataract surgery.  Risks, benefits, limitations, and alternatives regarding cataract surgery have been reviewed with the patient.  Questions have been answered.  All parties agreeable.   Benay Pillow, MD  05/19/2020, 8:24 AM

## 2020-05-20 ENCOUNTER — Encounter: Payer: Self-pay | Admitting: Ophthalmology

## 2020-05-26 ENCOUNTER — Encounter: Payer: Self-pay | Admitting: Ophthalmology

## 2020-05-29 ENCOUNTER — Other Ambulatory Visit: Payer: Self-pay

## 2020-05-29 ENCOUNTER — Other Ambulatory Visit
Admission: RE | Admit: 2020-05-29 | Discharge: 2020-05-29 | Disposition: A | Discharge status: Home / Self Care | Payer: Medicare Other | Source: Ambulatory Visit | Attending: Ophthalmology | Admitting: Ophthalmology

## 2020-05-29 DIAGNOSIS — Z01812 Encounter for preprocedural laboratory examination: Secondary | ICD-10-CM | POA: Diagnosis present

## 2020-05-29 DIAGNOSIS — Z20822 Contact with and (suspected) exposure to covid-19: Secondary | ICD-10-CM | POA: Insufficient documentation

## 2020-05-29 LAB — SARS CORONAVIRUS 2 (TAT 6-24 HRS): SARS Coronavirus 2: NEGATIVE

## 2020-05-29 NOTE — Discharge Instructions (Signed)

## 2020-06-02 ENCOUNTER — Ambulatory Visit
Admission: RE | Admit: 2020-06-02 | Discharge: 2020-06-02 | Disposition: A | Discharge status: Home / Self Care | Payer: Medicare Other | Attending: Ophthalmology | Admitting: Ophthalmology

## 2020-06-02 ENCOUNTER — Encounter
Admission: RE | Disposition: A | Discharge status: Home / Self Care | Payer: Self-pay | Source: Home / Self Care | Attending: Ophthalmology

## 2020-06-02 ENCOUNTER — Other Ambulatory Visit: Payer: Self-pay

## 2020-06-02 ENCOUNTER — Ambulatory Visit: Payer: Medicare Other | Admitting: Anesthesiology

## 2020-06-02 ENCOUNTER — Encounter: Payer: Self-pay | Admitting: Ophthalmology

## 2020-06-02 DIAGNOSIS — Z888 Allergy status to other drugs, medicaments and biological substances status: Secondary | ICD-10-CM | POA: Diagnosis not present

## 2020-06-02 DIAGNOSIS — Z85820 Personal history of malignant melanoma of skin: Secondary | ICD-10-CM | POA: Diagnosis not present

## 2020-06-02 DIAGNOSIS — Z885 Allergy status to narcotic agent status: Secondary | ICD-10-CM | POA: Diagnosis not present

## 2020-06-02 DIAGNOSIS — H2512 Age-related nuclear cataract, left eye: Secondary | ICD-10-CM | POA: Diagnosis not present

## 2020-06-02 DIAGNOSIS — Z90711 Acquired absence of uterus with remaining cervical stump: Secondary | ICD-10-CM | POA: Insufficient documentation

## 2020-06-02 DIAGNOSIS — Z79899 Other long term (current) drug therapy: Secondary | ICD-10-CM | POA: Diagnosis not present

## 2020-06-02 DIAGNOSIS — Z7901 Long term (current) use of anticoagulants: Secondary | ICD-10-CM | POA: Insufficient documentation

## 2020-06-02 DIAGNOSIS — N189 Chronic kidney disease, unspecified: Secondary | ICD-10-CM | POA: Insufficient documentation

## 2020-06-02 DIAGNOSIS — Z880 Allergy status to penicillin: Secondary | ICD-10-CM | POA: Diagnosis not present

## 2020-06-02 DIAGNOSIS — I129 Hypertensive chronic kidney disease with stage 1 through stage 4 chronic kidney disease, or unspecified chronic kidney disease: Secondary | ICD-10-CM | POA: Insufficient documentation

## 2020-06-02 HISTORY — PX: CATARACT EXTRACTION W/PHACO: SHX586

## 2020-06-02 SURGERY — PHACOEMULSIFICATION, CATARACT, WITH IOL INSERTION
Anesthesia: Monitor Anesthesia Care | Site: Eye | Laterality: Left

## 2020-06-02 MED ORDER — ARMC OPHTHALMIC DILATING DROPS
1.0000 "application " | OPHTHALMIC | Status: DC | PRN
  Administered 2020-06-02 (×3): 1 via OPHTHALMIC

## 2020-06-02 MED ORDER — ACETAMINOPHEN 160 MG/5ML PO SOLN: 325.0000 mg | Freq: Once | ORAL | Status: DC

## 2020-06-02 MED ORDER — LACTATED RINGERS IV SOLN: INTRAVENOUS | Status: DC

## 2020-06-02 MED ORDER — MIDAZOLAM HCL 2 MG/2ML IJ SOLN
INTRAMUSCULAR | Status: DC | PRN
  Administered 2020-06-02: 2 mg via INTRAVENOUS

## 2020-06-02 MED ORDER — LIDOCAINE HCL (PF) 2 % IJ SOLN
INTRAOCULAR | Status: DC | PRN
  Administered 2020-06-02: 4 mL via INTRAOCULAR

## 2020-06-02 MED ORDER — SODIUM HYALURONATE 10 MG/ML IO SOLN
INTRAOCULAR | Status: DC | PRN
  Administered 2020-06-02: 0.55 mL via INTRAOCULAR

## 2020-06-02 MED ORDER — FENTANYL CITRATE (PF) 100 MCG/2ML IJ SOLN
INTRAMUSCULAR | Status: DC | PRN
  Administered 2020-06-02 (×2): 50 ug via INTRAVENOUS

## 2020-06-02 MED ORDER — MOXIFLOXACIN HCL 0.5 % OP SOLN
OPHTHALMIC | Status: DC | PRN
  Administered 2020-06-02: 0.2 mL via OPHTHALMIC

## 2020-06-02 MED ORDER — SODIUM HYALURONATE 23 MG/ML IO SOLN
INTRAOCULAR | Status: DC | PRN
  Administered 2020-06-02: 0.6 mL via INTRAOCULAR

## 2020-06-02 MED ORDER — EPINEPHRINE PF 1 MG/ML IJ SOLN
INTRAOCULAR | Status: DC | PRN
  Administered 2020-06-02: 61 mL via OPHTHALMIC

## 2020-06-02 MED ORDER — TETRACAINE HCL 0.5 % OP SOLN
1.0000 [drp] | OPHTHALMIC | Status: DC | PRN
  Administered 2020-06-02 (×3): 1 [drp] via OPHTHALMIC

## 2020-06-02 MED ORDER — ACETAMINOPHEN 325 MG PO TABS: 325.0000 mg | ORAL_TABLET | Freq: Once | ORAL | Status: DC

## 2020-06-02 SURGICAL SUPPLY — 19 items
CANNULA ANT/CHMB 27G (MISCELLANEOUS) ×2 IMPLANT
CANNULA ANT/CHMB 27GA (MISCELLANEOUS) ×4 IMPLANT
DISSECTOR HYDRO NUCLEUS 50X22 (MISCELLANEOUS) ×2 IMPLANT
GLOVE SURG LX 7.5 STRW (GLOVE) ×1
GLOVE SURG LX STRL 7.5 STRW (GLOVE) ×1 IMPLANT
GLOVE SURG SYN 8.5  E (GLOVE) ×1
GLOVE SURG SYN 8.5 E (GLOVE) ×1 IMPLANT
GLOVE SURG SYN 8.5 PF PI (GLOVE) ×1 IMPLANT
GOWN STRL REUS W/ TWL LRG LVL3 (GOWN DISPOSABLE) ×2 IMPLANT
GOWN STRL REUS W/TWL LRG LVL3 (GOWN DISPOSABLE) ×4
LENS IOL TECNIS EYHANCE 13.5 (Intraocular Lens) ×1 IMPLANT
MARKER SKIN DUAL TIP RULER LAB (MISCELLANEOUS) ×2 IMPLANT
PACK DR. KING ARMS (PACKS) ×2 IMPLANT
PACK EYE AFTER SURG (MISCELLANEOUS) ×2 IMPLANT
PACK OPTHALMIC (MISCELLANEOUS) ×2 IMPLANT
SYR 3ML LL SCALE MARK (SYRINGE) ×2 IMPLANT
SYR TB 1ML LUER SLIP (SYRINGE) ×2 IMPLANT
WATER STERILE IRR 250ML POUR (IV SOLUTION) ×2 IMPLANT
WIPE NON LINTING 3.25X3.25 (MISCELLANEOUS) ×2 IMPLANT

## 2020-06-02 NOTE — Anesthesia Preprocedure Evaluation (Signed)
Anesthesia Evaluation  Patient identified by MRN, date of birth, ID band Patient awake    Reviewed: Allergy & Precautions, H&P , NPO status , Patient's Chart, lab work & pertinent test results  Airway Mallampati: II  TM Distance: >3 FB Neck ROM: full    Dental no notable dental hx. (+) Missing, Partial Lower   Pulmonary    Pulmonary exam normal breath sounds clear to auscultation       Cardiovascular hypertension, Normal cardiovascular exam Rhythm:regular Rate:Normal     Neuro/Psych PSYCHIATRIC DISORDERS    GI/Hepatic   Endo/Other    Renal/GU Renal disease     Musculoskeletal   Abdominal   Peds  Hematology   Anesthesia Other Findings   Reproductive/Obstetrics                             Anesthesia Physical  Anesthesia Plan  ASA: II  Anesthesia Plan: MAC   Post-op Pain Management:    Induction:   PONV Risk Score and Plan: 2 and Treatment may vary due to age or medical condition, Midazolam and TIVA  Airway Management Planned:   Additional Equipment:   Intra-op Plan:   Post-operative Plan:   Informed Consent: I have reviewed the patients History and Physical, chart, labs and discussed the procedure including the risks, benefits and alternatives for the proposed anesthesia with the patient or authorized representative who has indicated his/her understanding and acceptance.     Dental Advisory Given  Plan Discussed with: CRNA  Anesthesia Plan Comments:         Anesthesia Quick Evaluation

## 2020-06-02 NOTE — H&P (Signed)
[facility]   Primary Care Physician:  [place] Ophthalmologist: Dr. Benay Pillow  Pre-Procedure History & Physical: HPI:  Shannon Le is a 75 y.o. female here for cataract surgery.   Past Medical History:  Diagnosis Date  . 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 Chronic kidney disease    stage 3  . Depression   . Hypertension   . MGUS (monoclonal gammopathy of unknown significance)   . Monoclonal gammopathy of undetermined significance 2014   [place]  . Wears dentures    partial lower    Past Surgical History:  Procedure Laterality Date  . ABDOMINAL HYSTERECTOMY     partial  . CARPAL TUNNEL RELEASE Right    and ganglion  . CATARACT EXTRACTION W/PHACO Right 05/19/2020   Procedure: CATARACT EXTRACTION PHACO AND INTRAOCULAR LENS PLACEMENT ([place]) RIGHT;  Surgeon: Eulogio Bear, MD;  Location: [place];  Service: Ophthalmology;  Laterality: Right;  2.61 0:25.0  . GANGLION CYST EXCISION Right    hand x 2  . MELANOMA EXCISION  05/2013   mid back Dr. Aubery Lapping  . PARTIAL HYSTERECTOMY     endometriosis  . SHOULDER ARTHROSCOPY W/ ACROMIAL REPAIR Right     Prior to Admission medications   Medication Sig Start Date End Date Taking? Authorizing Provider  acetaminophen (TYLENOL) 500 MG tablet Take 1,000 mg by mouth every 6 (six) hours as needed.   Yes [provider]  ALPRAZolam Duanne Moron) 0.5 MG tablet TAKE 1 TABLET BY MOUTH EVERY NIGHT AT BEDTIME AS NEEDED 05/22/15  Yes Glean Hess, MD  benazepril (LOTENSIN) 20 MG tablet Take 20 mg by mouth daily.   Yes [provider]  Biotin 5000 MCG CAPS Take 1 capsule by mouth at bedtime.    Yes [provider]  buPROPion (WELLBUTRIN SR) 150 MG 12 hr tablet TAKE 1 TABLET BY MOUTH TWICE A DAY Patient taking differently: daily. 08/19/15  Yes Glean Hess, MD  cetirizine (ZYRTEC) 10 MG tablet Take 10 mg by mouth daily as needed for allergies.    Yes [provider]  citalopram (CELEXA) 10 MG tablet TAKE 1 TABLET BY MOUTH EVERY DAY 06/27/15  Yes Glean Hess, MD  diclofenac Sodium (VOLTAREN) 1 % GEL Apply topically as needed.   Yes [provider]  diltiazem (TIAZAC) 360 MG 24 hr capsule Take 360 mg by mouth daily.   Yes [provider]  diphenhydrAMINE (BENADRYL) 25 mg capsule Take 25 mg by mouth every 6 (six) hours as needed.   Yes [provider]  ELIQUIS 5 MG TABS tablet TAKE 1 TABLET BY MOUTH EVERY 12 HOURS 08/26/15  Yes Glean Hess, MD  furosemide (LASIX) 20 MG tablet Take 20 mg by mouth.   Yes [provider]  GUAIFENESIN ER PO Take by mouth 2 (two) times daily as needed.   Yes [provider]  magnesium oxide (MAG-OX) 400 MG tablet Take 400 mg by mouth 2 (two) times daily.   Yes [provider]  metoprolol succinate (TOPROL-XL) 100 MG 24 hr tablet TAKE 1 TABLET BY MOUTH EVERY DAY Patient taking differently: Take 25 mg by mouth daily. 05/22/15  Yes Glean Hess, MD  Multiple Vitamin (MULTIVITAMIN) tablet Take 1 tablet by mouth daily.   Yes [provider]  omeprazole (PRILOSEC) 40 MG capsule Take 40 mg by mouth daily.   Yes [provider]  vitamin B-12 (CYANOCOBALAMIN) 500 MCG  tablet Take 500 mcg by mouth daily.   Yes [provider]    Allergies as of 04/03/2020 - Review Complete 01/23/2018  Allergen Reaction Noted  . Codeine Shortness Of Breath 07/23/2014  . Alendronate sodium Other (See Comments) 09/19/2014  . Atorvastatin Diarrhea and Nausea And Vomiting 07/23/2014  . Bee venom Other (See Comments) 09/19/2014  . Diazepam  09/19/2014  . Fosamax  [alendronate]  07/23/2014  . Penicillins Swelling 07/23/2014  . Zosyn [piperacillin sod-tazobactam so] Other (See Comments) 07/14/2015    Family History  Problem Relation Age of Onset  . Mental illness Other   . Diabetes Mother   . Diabetes Brother   . Colon cancer Brother    . Stroke Brother     Social History   Socioeconomic History  . Marital status: Divorced    Spouse name: Not on file  . Number of children: Not on file  . Years of education: Not on file  . Highest education level: Not on file  Occupational History  . Not on file  Tobacco Use  . Smoking status: Never Smoker  . Smokeless tobacco: Never Used  Vaping Use  . Vaping Use: Never used  Substance and Sexual Activity  . Alcohol use: No    Alcohol/week: 0.0 standard drinks  . Drug use: No  . Sexual activity: Not on file  Other Topics Concern  . Not on file  Social History Narrative  . Not on file   Social Determinants of Health   Financial Resource Strain: Not on file  Food Insecurity: Not on file  Transportation Needs: Not on file  Physical Activity: Not on file  Stress: Not on file  Social Connections: Not on file  Intimate Partner Violence: Not on file    Review of Systems: See HPI, otherwise negative ROS  Physical Exam: BP 129/60   Pulse 78   Temp (!) 97 F (36.1 C) (Temporal)   Ht 5\' 1"  (1.549 m)   Wt 78.5 kg   SpO2 96%   BMI 32.69 kg/m  General:   Alert,  pleasant and cooperative in NAD Head:  Normocephalic and atraumatic. Respiratory:  Normal work of breathing.  Impression/Plan: Shannon Le is here for cataract surgery.  Risks, benefits, limitations, and alternatives regarding cataract surgery have been reviewed with the patient.  Questions have been answered.  All parties agreeable.   Benay Pillow, MD  06/02/2020, 9:05 AM

## 2020-06-02 NOTE — Anesthesia Postprocedure Evaluation (Signed)
Anesthesia Post Note  Patient: Shannon Le  Procedure(s) Performed: CATARACT EXTRACTION PHACO AND INTRAOCULAR LENS PLACEMENT ([facility]) LEFT 3.30 00:27.8 (Left Eye)     Patient location during evaluation: PACU Anesthesia Type: MAC Level of consciousness: awake and alert and oriented Pain management: satisfactory to patient Vital Signs Assessment: post-procedure vital signs reviewed and stable Respiratory status: spontaneous breathing, nonlabored ventilation and respiratory function stable Cardiovascular status: blood pressure returned to baseline and stable Postop Assessment: Adequate PO intake and No signs of nausea or vomiting Anesthetic complications: no   No complications documented.  Raliegh Ip

## 2020-06-02 NOTE — Transfer of Care (Signed)
Immediate Anesthesia Transfer of Care Note  Patient: Shannon Le  Procedure(s) Performed: CATARACT EXTRACTION PHACO AND INTRAOCULAR LENS PLACEMENT ([facility]) LEFT 3.30 00:27.8 (Left Eye)  Patient Location: PACU  Anesthesia Type: MAC  Level of Consciousness: awake, alert  and patient cooperative  Airway and Oxygen Therapy: Patient Spontanous Breathing and Patient connected to supplemental oxygen  Post-op Assessment: Post-op Vital signs reviewed, Patient's Cardiovascular Status Stable, Respiratory Function Stable, Patent Airway and No signs of Nausea or vomiting  Post-op Vital Signs: Reviewed and stable  Complications: No complications documented.

## 2020-06-02 NOTE — Op Note (Signed)
OPERATIVE NOTE  Shannon Le [id number] 06/02/2020   PREOPERATIVE DIAGNOSIS:  Nuclear sclerotic cataract left eye.  H25.12   POSTOPERATIVE DIAGNOSIS:    Nuclear sclerotic cataract left eye.     PROCEDURE:  Phacoemusification with posterior chamber intraocular lens placement of the left eye   LENS:   Implant Name Type Inv. Item Serial No. Manufacturer Lot No. LRB No. Used Action  LENS IOL TECNIS EYHANCE 13.5 - [id number] Intraocular Lens LENS IOL TECNIS EYHANCE 13.5 [id number] JOHNSON   Left 1 Implanted      Procedure(s): CATARACT EXTRACTION PHACO AND INTRAOCULAR LENS PLACEMENT ([facility]) LEFT 3.30 00:27.8 (Left)  DIB00 +13.5   ULTRASOUND TIME: 0 minutes 27 seconds.  CDE 3.30   SURGEON:  Benay Pillow, MD, MPH   ANESTHESIA:  Topical with tetracaine drops augmented with 1% preservative-free intracameral lidocaine.  ESTIMATED BLOOD LOSS: <1 mL   COMPLICATIONS:  None.   DESCRIPTION OF PROCEDURE:  The patient was identified in the holding room and transported to the operating room and placed in the supine position under the operating microscope.  The left eye was identified as the operative eye and it was prepped and draped in the usual sterile ophthalmic fashion.   A 1.0 millimeter clear-corneal paracentesis was made at the 5:00 position. 0.5 ml of preservative-free 1% lidocaine with epinephrine was injected into the anterior chamber.  The anterior chamber was filled with Healon 5 viscoelastic.  A 2.4 millimeter keratome was used to make a near-clear corneal incision at the 2:00 position.  A curvilinear capsulorrhexis was made with a cystotome and capsulorrhexis forceps.  Balanced salt solution was used to hydrodissect and hydrodelineate the nucleus.   Phacoemulsification was then used in stop and chop fashion to remove the lens nucleus and epinucleus.  The remaining cortex was then removed using the irrigation and aspiration handpiece. Healon was then placed into the capsular bag to  distend it for lens placement.  A lens was then injected into the capsular bag.  The remaining viscoelastic was aspirated.   Wounds were hydrated with balanced salt solution.  The anterior chamber was inflated to a physiologic pressure with balanced salt solution.  Intracameral vigamox 0.1 mL undiltued was injected into the eye and a drop placed onto the ocular surface.  No wound leaks were noted.  The patient was taken to the recovery room in stable condition without complications of anesthesia or surgery  Benay Pillow 06/02/2020, 9:29 AM

## 2020-06-02 NOTE — Anesthesia Procedure Notes (Signed)
Procedure Name: MAC Date/Time: 06/02/2020 9:13 AM Performed by: Jeannene Patella, CRNA Pre-anesthesia Checklist: Patient identified, Emergency Drugs available, Suction available, Timeout performed and Patient being monitored Patient Re-evaluated:Patient Re-evaluated prior to induction Oxygen Delivery Method: Nasal cannula Placement Confirmation: positive ETCO2

## 2020-09-01 ENCOUNTER — Inpatient Hospital Stay: Admission: RE | Admit: 2020-09-01 | Payer: Medicare Other | Source: Ambulatory Visit

## 2021-07-22 ENCOUNTER — Other Ambulatory Visit: Payer: Self-pay | Admitting: Nurse Practitioner

## 2021-07-22 DIAGNOSIS — Z1231 Encounter for screening mammogram for malignant neoplasm of breast: Secondary | ICD-10-CM

## 2021-08-12 ENCOUNTER — Emergency Department: Payer: Medicare Other

## 2021-08-12 ENCOUNTER — Other Ambulatory Visit: Payer: Self-pay

## 2021-08-12 ENCOUNTER — Emergency Department
Admission: EM | Admit: 2021-08-12 | Discharge: 2021-08-12 | Disposition: A | Discharge status: Home / Self Care | Payer: Medicare Other | Attending: Emergency Medicine | Admitting: Emergency Medicine

## 2021-08-12 DIAGNOSIS — S50811A Abrasion of right forearm, initial encounter: Secondary | ICD-10-CM | POA: Diagnosis not present

## 2021-08-12 DIAGNOSIS — Y92481 Parking lot as the place of occurrence of the external cause: Secondary | ICD-10-CM | POA: Insufficient documentation

## 2021-08-12 DIAGNOSIS — Z7901 Long term (current) use of anticoagulants: Secondary | ICD-10-CM | POA: Diagnosis not present

## 2021-08-12 DIAGNOSIS — S60511A Abrasion of right hand, initial encounter: Secondary | ICD-10-CM | POA: Diagnosis not present

## 2021-08-12 DIAGNOSIS — W01198A Fall on same level from slipping, tripping and stumbling with subsequent striking against other object, initial encounter: Secondary | ICD-10-CM | POA: Diagnosis not present

## 2021-08-12 DIAGNOSIS — I4891 Unspecified atrial fibrillation: Secondary | ICD-10-CM | POA: Insufficient documentation

## 2021-08-12 DIAGNOSIS — S0003XA Contusion of scalp, initial encounter: Secondary | ICD-10-CM | POA: Diagnosis not present

## 2021-08-12 DIAGNOSIS — S0990XA Unspecified injury of head, initial encounter: Secondary | ICD-10-CM | POA: Diagnosis present

## 2021-08-12 NOTE — ED Triage Notes (Signed)
Pt states that she is on eliquis and has been since 2017, pt denies loc, large hematoma noted to right forehead ?

## 2021-08-12 NOTE — ED Provider Notes (Signed)
? ?[facility] ?Provider Note ? ? ? Event Date/Time  ? First MD Initiated Contact with Patient 08/12/21 1846   ?  (approximate) ? ? ?History  ? ?Head Injury ? ? ?HPI ? ?Shannon Le is a 76 y.o. female who presents to the ED for evaluation of Head Injury ?  ?I reviewed cardiology clinic visit from 2/28.  She is anticoagulated on Eliquis due to atrial fibrillation. ? ?Patient presents to the ED for evaluation of a mechanical fall and associated head injury.  She reports walking out of a store with her typical cane when she stepped on the curb, lost her balance and fell forward and struck her forehead.  Denies any syncope.  Reports catching herself with her hands as well and has a small abrasion to her right hand and forearm.  She has been up and ambulatory since then, she reports driving herself home after this, but reluctantly, to get checked out because she is on the blood thinner.  Reports a headache, but denies my offer for analgesic medications. ? ? ?Physical Exam  ? ?Triage Vital Signs: ?ED Triage Vitals [08/12/21 1619]  ?Enc Vitals Group  ?   BP (!) 157/71  ?   Pulse Rate 62  ?   Resp 16  ?   Temp 98.4 ?F (36.9 ?C)  ?   Temp Source Oral  ?   SpO2 96 %  ?   Weight 145 lb (65.8 kg)  ?   Height 5' (1.524 m)  ?   Head Circumference   ?   Peak Flow   ?   Pain Score 0  ?   Pain Loc   ?   Pain Edu?   ?   Excl. in [place]?   ? ? ?Most recent vital signs: ?Vitals:  ? 08/12/21 1850 08/12/21 1900  ?BP: (!) 185/85 (!) 163/79  ?Pulse: 66 62  ?Resp: 18 16  ?Temp:    ?SpO2: 100% 99%  ? ? ?General: Awake, no distress.  Pleasant and conversational in full sentences. ?CV:  Good peripheral perfusion.  ?Resp:  Normal effort.  ?Abd:  No distention.  ?MSK:  Couple superficial abrasions to the dorsum of the right forearm without discrete laceration or any active bleeding. ?Neuro:  No focal deficits appreciated. Cranial nerves II through XII intact ?5/5 strength and sensation in all 4 extremities ?Other:  Golf  ball sized hematoma to the right forehead that is closed without bleeding, EOM entrapment, no bony step-offs or other facial trauma ? ? ?ED Results / Procedures / Treatments  ? ?Labs ?(all labs ordered are listed, but only abnormal results are displayed) ?Labs Reviewed - No data to display ? ?EKG ? ? ?RADIOLOGY ?CT head interpreted by me without evidence of acute intracranial pathology ?CT cervical spine interpreted by me without evidence of acute fracture ? ?Official radiology report(s): ?CT Head Wo Contrast ? ?Result Date: 08/12/2021 ?CLINICAL DATA:  Fall.  On blood thinners. EXAM: CT HEAD WITHOUT CONTRAST TECHNIQUE: Contiguous axial images were obtained from the base of the skull through the vertex without intravenous contrast. RADIATION DOSE REDUCTION: This exam was performed according to the departmental dose-optimization program which includes automated exposure control, adjustment of the mA and/or kV according to patient size and/or use of iterative reconstruction technique. COMPARISON:  None Available. FINDINGS: Brain: No evidence of acute infarction, hemorrhage, hydrocephalus, extra-axial collection or mass lesion/mass effect. Mild generalized cerebral atrophy, within normal limits for age. Vascular: Calcified atherosclerosis at the  skull base. No hyperdense vessel. Skull: Normal. Negative for fracture or focal lesion. Sinuses/Orbits: No acute finding. Other: Small right frontal scalp hematoma. IMPRESSION: 1. No acute intracranial abnormality. Small right frontal scalp hematoma. Electronically Signed   By: Titus Dubin M.D.   On: 08/12/2021 16:51  ? ?CT Cervical Spine Wo Contrast ? ?Result Date: 08/12/2021 ?CLINICAL DATA:  Facial trauma, blunt EXAM: CT CERVICAL SPINE WITHOUT CONTRAST TECHNIQUE: Multidetector CT imaging of the cervical spine was performed without intravenous contrast. Multiplanar CT image reconstructions were also generated. RADIATION DOSE REDUCTION: This exam was performed according to the  departmental dose-optimization program which includes automated exposure control, adjustment of the mA and/or kV according to patient size and/or use of iterative reconstruction technique. COMPARISON:  None Available. FINDINGS: Alignment: Mild anterolisthesis of C4 on C5, favor degenerative given facet and disc degenerative change at this level. Otherwise, no substantial sagittal subluxation. Straightening. S-shaped cervical curvature. Skull base and vertebrae: Vertebral body heights are maintained. Soft tissues and spinal canal: No prevertebral fluid or swelling. No visible canal hematoma. Disc levels: Multilevel facet uncovertebral hypertrophy with varying degrees of neural foraminal stenosis. Upper chest: Biapical pleuroparenchymal scarring. Otherwise, visualized lung apices are clear. Other: Approximately 1.5 cm left thyroid nodule. IMPRESSION: 1. No evidence of acute fracture or traumatic malalignment. 2. Multilevel degenerative change, detailed above. 3. Approximately 1.5 cm left thyroid nodule. Recommend thyroid US (ref: J Am Coll Radiol. 2015 Feb;12(2): 143-50). Electronically Signed   By: Margaretha Sheffield M.D.   On: 08/12/2021 16:52   ? ?PROCEDURES and INTERVENTIONS: ? ?Procedures ? ?Medications - No data to display ? ? ?IMPRESSION / MDM / ASSESSMENT AND PLAN / ED COURSE  ?I reviewed the triage vital signs and the nursing notes. ? ?Differential diagnosis includes, but is not limited to, skull fracture, entrapped EOM, intracranial hemorrhage, stroke ? ?{Patient presents with symptoms of an acute illness or injury that is potentially life-threatening. ? ?Anticoagulated 76 year old female presents to the ED after mechanical fall with head injury, with a closed hematoma but no evidence of ICH or skull fracture and suitable for outpatient management.  Looks systemically well.  No laceration to require repair.  No indications for imaging to the extremities.  We will discharge with return precautions. ? ?   ? ? ?FINAL CLINICAL IMPRESSION(S) / ED DIAGNOSES  ? ?Final diagnoses:  ?Injury of head, initial encounter  ?Anticoagulated  ?Hematoma of frontal scalp, initial encounter  ? ? ? ?Rx / DC Orders  ? ?ED Discharge Orders   ? ? None  ? ?  ? ? ? ?Note:  This document was prepared using Dragon voice recognition software and may include unintentional dictation errors. ?  ?Vladimir Crofts, MD ?08/12/21 1931 ? ?

## 2021-08-12 NOTE — ED Triage Notes (Signed)
Pt comes ems from home after a fall in [place] parking lot. Tripped on a ramp. Hematoma on forehead and is on thinners. Abrasions to forearms. 164/59.  ?

## 2021-09-01 ENCOUNTER — Ambulatory Visit: Payer: Medicare Other

## 2021-09-30 ENCOUNTER — Ambulatory Visit
Admission: RE | Admit: 2021-09-30 | Discharge: 2021-09-30 | Disposition: A | Discharge status: Home / Self Care | Payer: Medicare Other | Source: Ambulatory Visit | Attending: Nurse Practitioner | Admitting: Nurse Practitioner

## 2021-09-30 DIAGNOSIS — Z1231 Encounter for screening mammogram for malignant neoplasm of breast: Secondary | ICD-10-CM | POA: Diagnosis present
